# Patient Record
Sex: Female | Born: 1950
Health system: Southern US, Community
[De-identification: ages and names within clinical notes are randomized; demographics above are authoritative.]

## PROBLEM LIST (undated history)

## (undated) ENCOUNTER — Emergency Department (HOSPITAL_BASED_OUTPATIENT_CLINIC_OR_DEPARTMENT_OTHER): Discharge status: Absent without leave | Payer: PPO

## (undated) DIAGNOSIS — K8020 Calculus of gallbladder without cholecystitis without obstruction: Secondary | ICD-10-CM

## (undated) DIAGNOSIS — K648 Other hemorrhoids: Secondary | ICD-10-CM

## (undated) DIAGNOSIS — I10 Essential (primary) hypertension: Secondary | ICD-10-CM

## (undated) DIAGNOSIS — T7840XA Allergy, unspecified, initial encounter: Secondary | ICD-10-CM

## (undated) HISTORY — DX: Other hemorrhoids: K64.8

## (undated) HISTORY — DX: Allergy, unspecified, initial encounter: T78.40XA

## (undated) HISTORY — DX: Calculus of gallbladder without cholecystitis without obstruction: K80.20

---

## 1999-08-29 ENCOUNTER — Other Ambulatory Visit: Admission: RE | Admit: 1999-08-29 | Discharge: 1999-08-29 | Payer: Self-pay | Admitting: Obstetrics and Gynecology

## 2000-09-16 ENCOUNTER — Other Ambulatory Visit: Admission: RE | Admit: 2000-09-16 | Discharge: 2000-09-16 | Payer: Self-pay | Admitting: Obstetrics and Gynecology

## 2001-10-13 ENCOUNTER — Other Ambulatory Visit: Admission: RE | Admit: 2001-10-13 | Discharge: 2001-10-13 | Payer: Self-pay | Admitting: Obstetrics and Gynecology

## 2002-03-19 ENCOUNTER — Encounter: Admission: RE | Admit: 2002-03-19 | Discharge: 2002-03-19 | Payer: Self-pay | Admitting: Obstetrics and Gynecology

## 2002-03-19 ENCOUNTER — Encounter: Payer: Self-pay | Admitting: Obstetrics and Gynecology

## 2002-11-19 ENCOUNTER — Other Ambulatory Visit: Admission: RE | Admit: 2002-11-19 | Discharge: 2002-11-19 | Payer: Self-pay | Admitting: Obstetrics and Gynecology

## 2003-03-23 ENCOUNTER — Encounter: Payer: Self-pay | Admitting: Obstetrics and Gynecology

## 2003-03-23 ENCOUNTER — Encounter: Admission: RE | Admit: 2003-03-23 | Discharge: 2003-03-23 | Payer: Self-pay | Admitting: Obstetrics and Gynecology

## 2003-12-27 ENCOUNTER — Other Ambulatory Visit: Admission: RE | Admit: 2003-12-27 | Discharge: 2003-12-27 | Payer: Self-pay | Admitting: Obstetrics and Gynecology

## 2003-12-31 ENCOUNTER — Encounter: Admission: RE | Admit: 2003-12-31 | Discharge: 2003-12-31 | Payer: Self-pay | Admitting: Obstetrics and Gynecology

## 2004-02-16 ENCOUNTER — Encounter: Payer: Self-pay | Admitting: Internal Medicine

## 2004-03-23 ENCOUNTER — Encounter: Admission: RE | Admit: 2004-03-23 | Discharge: 2004-03-23 | Payer: Self-pay | Admitting: Obstetrics and Gynecology

## 2004-10-23 ENCOUNTER — Inpatient Hospital Stay (HOSPITAL_COMMUNITY): Admission: EM | Admit: 2004-10-23 | Discharge: 2004-10-25 | Payer: Self-pay | Admitting: Emergency Medicine

## 2004-12-12 ENCOUNTER — Encounter: Admission: RE | Admit: 2004-12-12 | Discharge: 2004-12-25 | Payer: Self-pay | Admitting: Neurosurgery

## 2005-01-29 ENCOUNTER — Inpatient Hospital Stay (HOSPITAL_COMMUNITY): Admission: RE | Admit: 2005-01-29 | Discharge: 2005-02-03 | Payer: Self-pay | Admitting: Neurosurgery

## 2005-04-24 ENCOUNTER — Encounter: Admission: RE | Admit: 2005-04-24 | Discharge: 2005-04-24 | Payer: Self-pay | Admitting: Obstetrics and Gynecology

## 2005-09-18 ENCOUNTER — Encounter: Admission: RE | Admit: 2005-09-18 | Discharge: 2005-11-01 | Payer: Self-pay | Admitting: Neurosurgery

## 2006-05-25 IMAGING — CR DG CHEST 2V
2 series · 2 of 2 positions shown · non-contrast
Comparison: None

CLINICAL DATA: Lumbar fracture, preop

CHEST - 2 VIEW:

[view not recorded (1 of 2)]
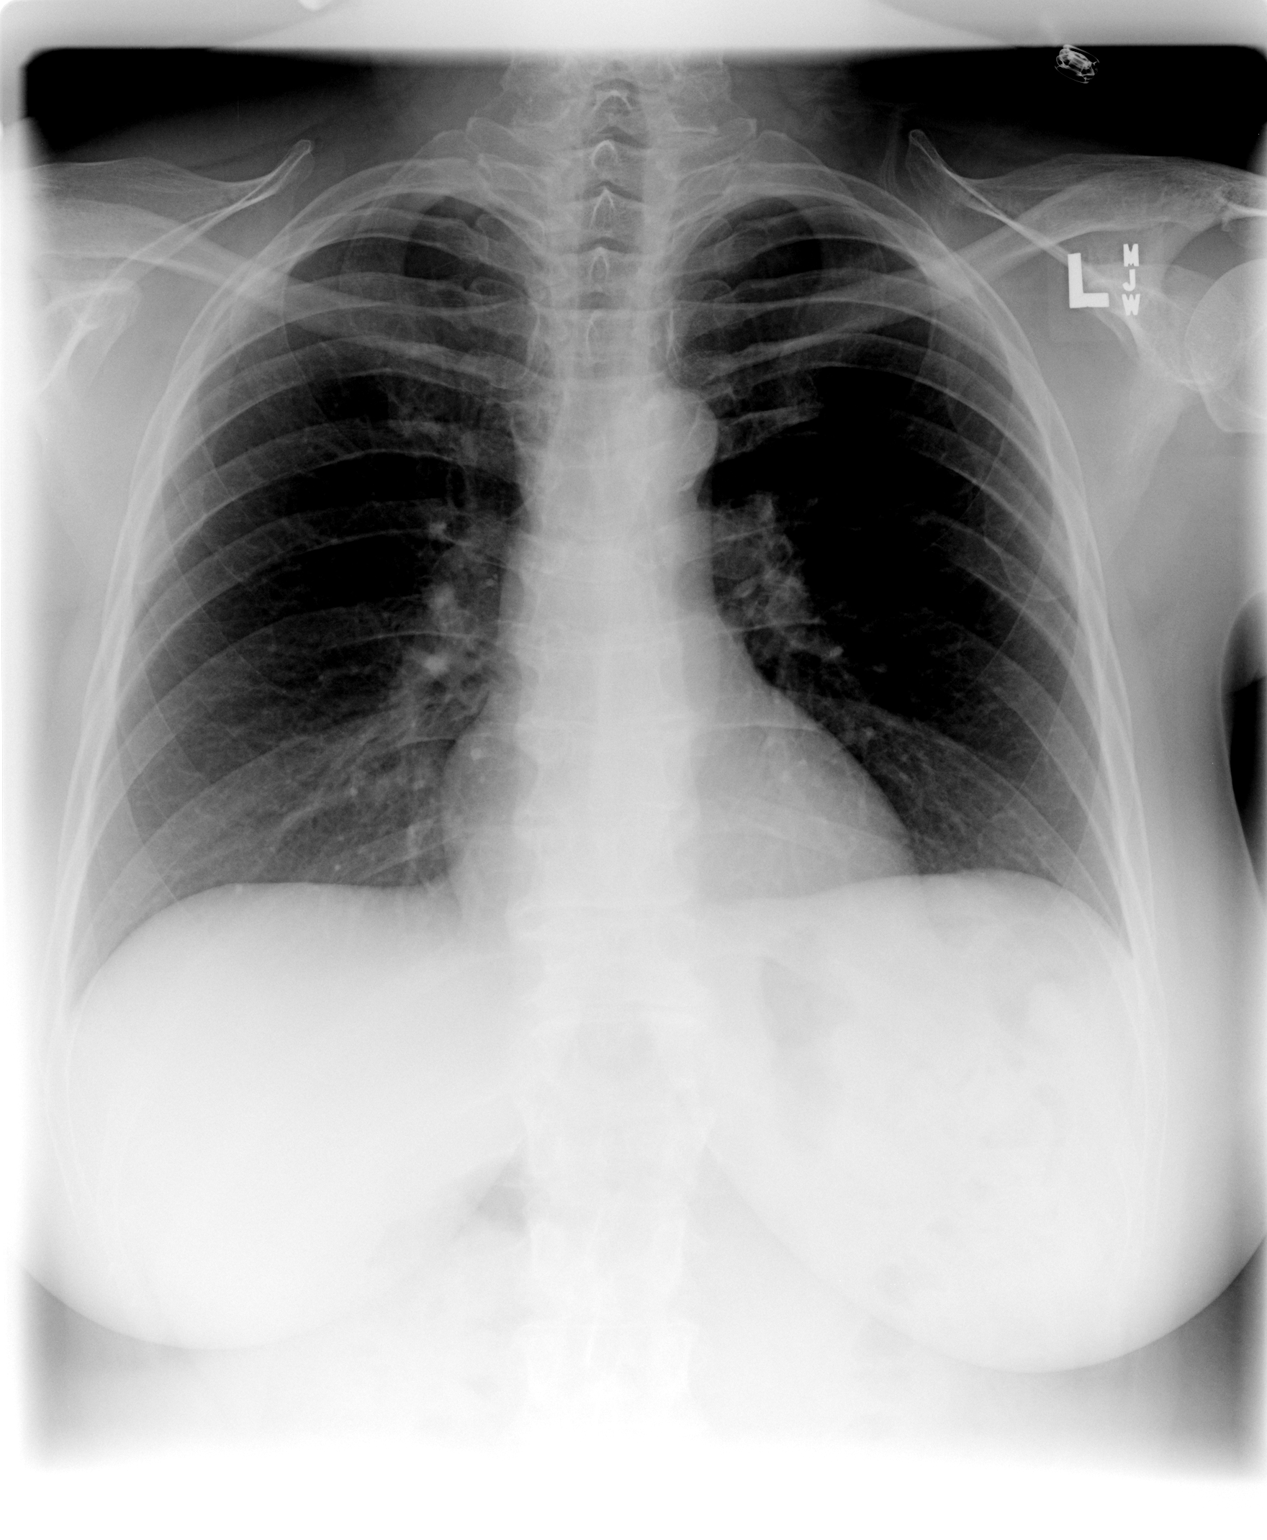

[view not recorded (2 of 2)]
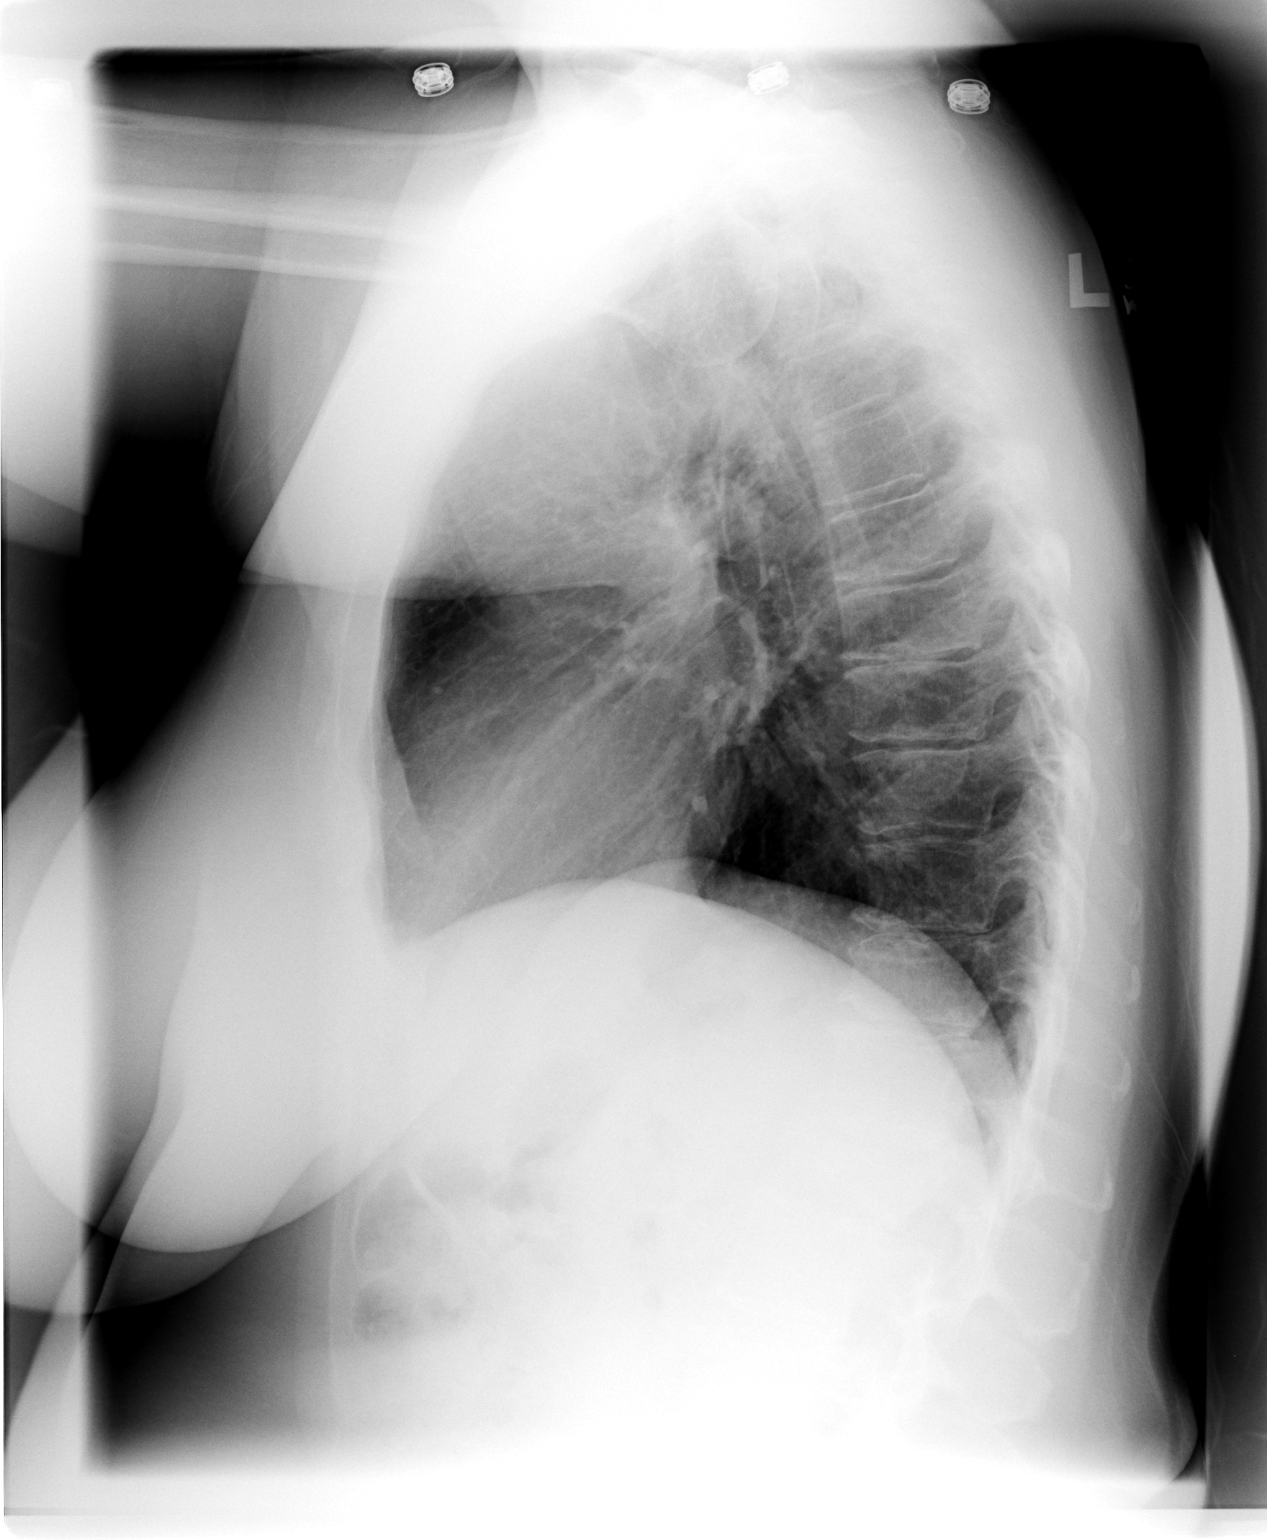

[2 of 2 positions shown; findings below may reference images not displayed]

FINDINGS: The heart size and mediastinal contours are within normal limits. 
Both lungs are clear.  The visualized skeletal structures are unremarkable.
IMPRESSION: No active cardiopulmonary disease

## 2006-05-31 ENCOUNTER — Encounter: Admission: RE | Admit: 2006-05-31 | Discharge: 2006-05-31 | Payer: Self-pay | Admitting: Obstetrics and Gynecology

## 2007-06-02 ENCOUNTER — Encounter: Admission: RE | Admit: 2007-06-02 | Discharge: 2007-06-02 | Payer: Self-pay | Admitting: Obstetrics and Gynecology

## 2007-10-01 ENCOUNTER — Ambulatory Visit: Payer: Self-pay | Admitting: Internal Medicine

## 2007-10-08 ENCOUNTER — Ambulatory Visit: Payer: Self-pay | Admitting: Internal Medicine

## 2007-10-08 LAB — CONVERTED CEMR LAB
Fecal Occult Blood: NEGATIVE
OCCULT 1: NEGATIVE
OCCULT 2: NEGATIVE
OCCULT 3: NEGATIVE
OCCULT 4: NEGATIVE
OCCULT 5: NEGATIVE

## 2008-01-26 DIAGNOSIS — N750 Cyst of Bartholin's gland: Secondary | ICD-10-CM | POA: Insufficient documentation

## 2008-01-26 DIAGNOSIS — K219 Gastro-esophageal reflux disease without esophagitis: Secondary | ICD-10-CM | POA: Insufficient documentation

## 2008-01-26 DIAGNOSIS — I10 Essential (primary) hypertension: Secondary | ICD-10-CM | POA: Insufficient documentation

## 2008-01-26 DIAGNOSIS — T7840XA Allergy, unspecified, initial encounter: Secondary | ICD-10-CM | POA: Insufficient documentation

## 2008-06-02 ENCOUNTER — Encounter: Admission: RE | Admit: 2008-06-02 | Discharge: 2008-06-02 | Payer: Self-pay | Admitting: Obstetrics and Gynecology

## 2009-02-02 ENCOUNTER — Ambulatory Visit: Payer: Self-pay | Admitting: Internal Medicine

## 2009-02-28 ENCOUNTER — Ambulatory Visit: Payer: Self-pay | Admitting: Internal Medicine

## 2009-02-28 ENCOUNTER — Encounter: Payer: Self-pay | Admitting: Internal Medicine

## 2009-03-01 ENCOUNTER — Encounter: Payer: Self-pay | Admitting: Internal Medicine

## 2009-06-09 ENCOUNTER — Encounter: Admission: RE | Admit: 2009-06-09 | Discharge: 2009-06-09 | Payer: Self-pay | Admitting: Obstetrics and Gynecology

## 2009-12-03 HISTORY — PX: BACK SURGERY: SHX140

## 2010-06-13 ENCOUNTER — Encounter: Admission: RE | Admit: 2010-06-13 | Discharge: 2010-06-13 | Payer: Self-pay | Admitting: Obstetrics and Gynecology

## 2010-12-25 ENCOUNTER — Encounter
Admission: RE | Admit: 2010-12-25 | Discharge: 2010-12-25 | Payer: Self-pay | Source: Home / Self Care | Attending: Obstetrics and Gynecology | Admitting: Obstetrics and Gynecology

## 2011-01-04 NOTE — Letter (Signed)
Summary: Patient Notice- Colon Biospy Results  [facility]  [address] [place], [place] [address]   Phone: [phone]  Fax: [phone]        March 01, 2009 MRN: [id number]    [facility] [address] [place], [place]  [address]    Dear Ms. Mankey,  I am pleased to inform you that the biopsies taken during your recent colonoscopy did not show any evidence of cancer upon pathologic examination.The polyp showed benign  tissue ( lymphoid  tissue)  Additional information/recommendations:  _x_No further action is needed at this time.  Please follow-up with      your primary care physician for your other healthcare needs.  __Please call [phone] to schedule a return visit to review      your condition.  __Continue with the treatment plan as outlined on the day of your      exam.  _x_You should have a repeat colonoscopy examination for this problem           in 7_ years.  Please call us if you are having persistent problems or have questions about your condition that have not been fully answered at this time.  Sincerely,  Hart Carwin   This letter has been electronically signed by your physician.

## 2011-01-04 NOTE — Procedures (Signed)
Summary: Colonoscopy   Colonoscopy  Procedure date:  02/28/2009  Findings:      Location:  [facility].    Procedures Next Due Date:    Colonoscopy: 03/2016  COLONOSCOPY PROCEDURE REPORT  PATIENT:  Sheryl Schmidt, Sheryl Schmidt  MR#:  [id number] BIRTHDATE:   02/14/1951, 57 yrs. old   GENDER:   female  ENDOSCOPIST:   Hedwig Morton. Juanda Chance, MD Referred by: Maxie Better, M.D.  PROCEDURE DATE:  02/28/2009 PROCEDURE:  Colonoscopy with biopsy ASA CLASS:   Class I INDICATIONS: family history of colon cancer F. hx of colon cancer in a maternal uncle, last colon 2005, heme positive stool in 2008, intermittent rectal bleeding, known external hemorrhoids  MEDICATIONS:    Versed 8 mg, Fentanyl 100 mcg  DESCRIPTION OF PROCEDURE:   After the risks benefits and alternatives of the procedure were thoroughly explained, informed consent was obtained.  Digital rectal exam was performed and revealed no rectal masses.   The LB [id number]-[id number] endoscope was introduced through the anus and advanced to the cecum, which was identified by both the appendix and ileocecal valve, without limitations.  The quality of the prep was good, using MiraLax.  The instrument was then slowly withdrawn as the colon was fully examined. <<PROCEDUREIMAGES>>        <<OLD IMAGES>>  FINDINGS:  External hemorrhoids were found.  This was otherwise a normal examination of the colon (see image3, image2, and image1).  A sessile polyp was found in the rectum. The polyp was removed using cold biopsy forceps (see image5 and image4).   Retroflexed views in the rectum revealed no abnormalities.    The scope was then withdrawn from the patient and the procedure completed.  COMPLICATIONS:   None  ENDOSCOPIC IMPRESSION:  1) External hemorrhoids  2) Otherwise normal examination  5 mm sessile polyp removed from the rectum RECOMMENDATIONS:  1) await pathology results  2) high fiber diet  REPEAT EXAM:   In 5 year(s)  for.   _______________________________ Hedwig Morton. Juanda Chance, MD  CC: Kirby Funk MD     REPORT OF SURGICAL PATHOLOGY   Case #: [id number] Patient Name: Sheryl Schmidt, Sheryl Schmidt. Office Chart Number:  [id number]   MRN: [id number] Pathologist: Beulah Gandy. Luisa Hart, MD DOB/Age  [birth year] (Age: 36)    Gender: F Date Taken:  02/28/2009 Date Received: 02/28/2009   FINAL DIAGNOSIS   ***MICROSCOPIC EXAMINATION AND DIAGNOSIS***   RECTAL POLYP:  POLYPOID COLONIC MUCOSA WITH BENIGN LYMPHOID AGGREGATES.  NO ADENOMATOUS CHANGE OR CARCINOMA IDENTIFIED.    mj Date Reported:  03/01/2009     Beulah Gandy. Luisa Hart, MD *** Electronically Signed Out By JDP ***   March 01, 2009 MRN: [id number]    [facility] [address] [place], [place]  [address]    Dear Sheryl Schmidt,  I am pleased to inform you that the biopsies taken during your recent colonoscopy did not show any evidence of cancer upon pathologic examination.The polyp showed benign  tissue ( lymphoid  tissue)  Additional information/recommendations:  _x_No further action is needed at this time.  Please follow-up with      your primary care physician for your other healthcare needs.  __Please call [phone] to schedule a return visit to review      your condition.  __Continue with the treatment plan as outlined on the day of your      exam.  _x_You should have a repeat colonoscopy examination for this problem  in 7_ years.  Please call us if you are having persistent problems or have questions about your condition that have not been fully answered at this time.  Sincerely,  Hart Carwin   This letter has been electronically signed by your physician.

## 2011-01-04 NOTE — Procedures (Signed)
Summary: Gastroenterology C  Gastroenterology C   Imported By: Lowry Ram CMA 01/27/2008 09:50:48  _____________________________________________________________________  External Attachment:    Type:   Image     Comment:   External Document

## 2011-01-04 NOTE — Miscellaneous (Signed)
Summary: previsit  Clinical Lists Changes  Medications: Added new medication of MIRALAX   POWD (POLYETHYLENE GLYCOL 3350) As per prep  instructions. - Signed Added new medication of DULCOLAX 5 MG  TBEC (BISACODYL) Day before procedure take 2 at 3pm and 2 at 8pm. - Signed Added new medication of METOCLOPRAMIDE HCL 10 MG  TABS (METOCLOPRAMIDE HCL) As per prep instructions. - Signed Rx of MIRALAX   POWD (POLYETHYLENE GLYCOL 3350) As per prep  instructions.;  #255gm x 0;  Signed;  Entered by: Alease Frame RN;  Authorized by: Hart Carwin MD;  Method used: [facility] [address] [phone]*, [address], [place], [place], [place]  [address], Ph: [phone] or [phone], Fax: [phone] Rx of DULCOLAX 5 MG  TBEC (BISACODYL) Day before procedure take 2 at 3pm and 2 at 8pm.;  #4 x 0;  Signed;  Entered by: Alease Frame RN;  Authorized by: Hart Carwin MD;  Method used: [facility] [address] [phone]*, [address] [place], [place], [place]  [address], Ph: [phone] or [phone], Fax: [phone] Rx of METOCLOPRAMIDE HCL 10 MG  TABS (METOCLOPRAMIDE HCL) As per prep instructions.;  #2 x 0;  Signed;  Entered by: Alease Frame RN;  Authorized by: Hart Carwin MD;  Method used: [facility]ically [facility] [address] [phone]*, [address] [place], [place], [place]  [address], Ph: [phone] or [phone], Fax: [phone] Allergies: Added new allergy or adverse reaction of SULFA Observations: Added new observation of ALLERGY REV: Done (02/02/2009 16:36) Added new observation of NKA: F (02/02/2009 16:36)    Prescriptions: METOCLOPRAMIDE HCL 10 MG  TABS (METOCLOPRAMIDE HCL) As per prep instructions.  #2 x 0   Entered by:   Alease Frame RN   Authorized by:   Hart Carwin MD   Signed by:   Alease Frame RN on 02/02/2009   Method used:   [facility]ronically to        [facility]  [address]  [phone]* (retail)       [address]       [place], [place]  [address]       Ph: [phone] or [phone]       Fax: [phone]   RxID:   [id number] DULCOLAX 5 MG  TBEC (BISACODYL) Day before procedure take 2 at 3pm and 2 at 8pm.  #4 x 0   Entered by:   Alease Frame RN   Authorized by:   Hart Carwin MD   Signed by:   Alease Frame RN on 02/02/2009   Method used:   [facility]ronically to        [facility] [phone]* (retail)       [address]       [place], [place]  [address]       Ph: [phone] or [phone]       Fax: [phone]   RxID:   [id number] MIRALAX   POWD (POLYETHYLENE GLYCOL 3350) As per prep  instructions.  #255gm x 0   Entered by:   Alease Frame RN   Authorized by:   Hart Carwin MD   Signed by:   Alease Frame RN on 02/02/2009   Method used:   Electronically to  [facility] [address] [phone]* (retail)       [address]       [place], [place]  [address]       Ph: [phone] or [phone]       Fax: [phone]   RxID:   [id number]

## 2011-04-17 NOTE — Assessment & Plan Note (Signed)
[facility]                         GASTROENTEROLOGY OFFICE NOTE   Sheryl Schmidt, Sheryl Schmidt                   MRN:          [id number]  DATE:10/01/2007                            DOB:          [birth year]    Ms. Sheryl Schmidt is a very nice 60 year old patient of Dr. Cherly Schmidt whom we  saw in March 2005 for screening colonoscopy.  She has a positive family  history of colon cancer in maternal uncle, but her colon exam was  entirely normal.  She did not have any polyps and her prep was good.  During her routine yearly exam with Dr. Cherly Schmidt, she had a home test for  occult blood in the stool which was positive.  The patient admits to  occasional bright red blood on the toilet tissue when she wipes and  blames it on hemorrhoids.  She denies any rectal pain, any prolapse of  the tissues of the rectum.  Her bowel habits are regular, somewhat on  the constipated side.  She is a regular blood donor, about 2 or 3 times  a year gives blood and has never been turned down.  Her last blood  donation was about 2 months ago.  She has never been anemic.  She denies  taking any aspirin or NSAIDs on a regular basis.  She has occasionally  taken ranitidine for gastroesophageal reflux.   MEDICATIONS:  1. Atacand 8 mg 1 p.o. daily.  2. Ranitidine 1 p.o. daily.   PAST HISTORY:  Significant for high blood pressure.   FAMILY HISTORY:  Diabetes on father's side.   SOCIAL HISTORY:  Divorced with 2 children.  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  She does smoke and does not drink alcohol.   REVIEW OF SYSTEMS:  Positive for eyeglasses, allergies, back pain for  which she takes ibuprofen on occasion.   PHYSICAL EXAMINATION:  Blood pressure 124/82, pulse 64 and weight 163  pounds.  She appeared healthy in no distress.  LUNGS:  Clear to auscultation.  COR:  Normal S2, normal S2.  ABDOMEN:  Soft, nontender, with normoactive bowel sounds.  Liver edge at  costal margin.  Lower  abdomen was normal.  Rectal and anoscopic exam  today reveals small internal hemorrhoids with some erythema, no definite  fissure, no bleeding.  Stool was trace Heme-positive.  There was an  external hemorrhoidal tag at the rectal os which was not active.  There  was no evidence of proctitis.   IMPRESSION:  A 60 year old white female with trace Heme-positive stool  on home test and reproduced on today's exam.  She does have visible  hemorrhoids, but they do not seem to be bleeding.  She is a regular  blood donor and has never been anemic.  The patient had a good quality  colon exam within the last 3-1/2 years.  She is hesitant to undergo  another colonoscopic exam, and I feel we probably ought to recheck her  stool Hemoccults again, and make sure we do it right.  Make sure she  does not take any NSAIDs at the time of the home stool test.  PLAN:  Repeat the Hemoccults.  If they are positive, she will undergo  repeat colonoscopy.  If they are negative, I would probably watch her  carefully, maybe even treat the hemorrhoids with the Anusol HC  suppositories and repeat the Hemoccults within 6 months.  Her normal  recall colonoscopy would have been in 2015, but if the stools remain  positive, she probably ought to have a colon exam before that.  We will  let the patient know about the results of her repeat home test.     Sheryl Schmidt. Sheryl Chance, MD  Electronically Signed    DMB/MedQ  DD: 10/01/2007  DT: 10/01/2007  Job #: [id number]   cc:   Sheryl Schmidt, M.D.

## 2011-04-20 NOTE — Discharge Summary (Signed)
NAMEJANINE, Sheryl Schmidt            ACCOUNT NO.:  [id number]   MEDICAL RECORD NO.:  [id number]          PATIENT TYPE:  INP   LOCATION:  3001                         FACILITY:  [facility]   PHYSICIAN:  Coletta Memos, M.D.     DATE OF BIRTH:  [birth year]   DATE OF ADMISSION:  01/29/2005  DATE OF DISCHARGE:  02/03/2005                                 DISCHARGE SUMMARY   ADMISSION DIAGNOSIS:  L1 compression fracture.   DISCHARGE DIAGNOSIS:  L1 compression fracture.   PROCEDURES:  1.  Retroperitoneal L1 corpectomy.  2.  T12-L2 fusion with instrumentation.   COMPLICATIONS:  None.   DISCHARGE STATUS:  Alive and well.   HISTORY OF PRESENT ILLNESS:  Sheryl Schmidt is a young lady who had an L1  compression fracture.  She did well, but had increasing kyphosis over the  period of time that she was followed by Dr. Lovell Sheehan.  He therefore  recommended and she agreed to undergo operative decompression and reduction.   HOSPITAL COURSE:  She was admitted, taken to the operating room and had an  uncomplicated procedure.  She had a very small pneumothorax postoperatively,  which was monitored.  She never problems with her respiratory effort or  efficiency.  At discharge, her wound is clean and dry with normal vital  signs at discharge.  She has 5/5 strength.  She is to be discharged home on  February 03, 2005.  She is tolerating a regular diet, eating well and voiding  without difficulty.      KC/MEDQ  D:  04/13/2005  T:  04/15/2005  Job:  [id number]

## 2011-04-20 NOTE — Discharge Summary (Signed)
[name], Sheryl Schmidt            ACCOUNT NO.:  [id number]   MEDICAL RECORD NO.:  [id number]          PATIENT TYPE:  INP   LOCATION:  3001                         FACILITY:  [facility]   PHYSICIAN:  Coletta Memos, M.D.     DATE OF BIRTH:  [birth year]   DATE OF ADMISSION:  01/29/2005  DATE OF DISCHARGE:  02/03/2005                                 DISCHARGE SUMMARY   ADMITTING DIAGNOSIS:  L1 compression fracture.   DISCHARGE DIAGNOSIS:  L1 compression fracture.   PROCEDURE:  Retroperitoneal L1 corpectomy, T12-L1 interbody fusion, with  instrumentation.   COMPLICATIONS:  None.   INDICATIONS:  [name] is a woman who fell from a ladder in November of  2005 suffering a L1 compression fracture.  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  As a result, it  was decided by Dr. Lovell Sheehan to take her to the operating room for fusion.   DESCRIPTION OF PROCEDURE:  She was taken to the operating room and had an  uncomplicated procedure.  Postoperatively, she did well.  She had a small  amount of pneumothorax which is not felt to be significant or need  treatment.  She was discharged home with a normal neurologic examination.  The wound was clean and dry without signs of infection.      KC/MEDQ  D:  03/30/2005  T:  03/31/2005  Job:  [id number]

## 2011-04-20 NOTE — H&P (Signed)
Sheryl Schmidt, Sheryl Schmidt            ACCOUNT NO.:  [id number]   MEDICAL RECORD NO.:  [id number]          PATIENT TYPE:  INP   LOCATION:  3021                         FACILITY:  [facility]   PHYSICIAN:  Cristi Loron, M.D.DATE OF BIRTH:  07-11-1951   DATE OF ADMISSION:  10/22/2004  DATE OF DISCHARGE:                                HISTORY & PHYSICAL   CHIEF COMPLAINT:  Back pain.   HISTORY OF PRESENT ILLNESS:  The patient is a 60 year old white female who  was up on a ladder/roof cleaning some gutters.  She fell off and landed on  her deck.  There was no loss of consciousness; the patient remembers the  whole event.  The patient had immediate onset of back pain, but got up and  tried to walk off.  She had back pain.  She went and called a neighbor,  who came over and they called EMS; she was brought to [facility]  where she was evaluated by Dr. Adriana Simas.  Evaluation included lumbar spine x-  rays and a lumbar spine CT which demonstrated L1 burst fracture, and a  neurosurgical consultation was requested.   Presently, the patient is pleasant.  She complains only of back pain in the  upper lumbar region.  She has no radicular leg pain, numbness, tingling,  weakness, etc.  She denies neck pain, headaches, seizures, nausea, vomiting,  numbness, tingling, weakness, etc.   PAST MEDICAL HISTORY:  Past history is positive for hypertension.   PAST SURGICAL HISTORY:  Cesarean sections.   MEDICATIONS PRIOR TO ADMISSION:  Atacand 4 mg p.o. daily.   DRUG ALLERGIES:  SULFA.   FAMILY MEDICAL HISTORY:  The patient's mother is age 18 and father is age  15, both fairly healthy.   SOCIAL HISTORY:  The patient is single.  She has 2 children.  She works at  [employer].  She lives in [place].  She denies tobacco, ethanol and drug  use.   REVIEW OF SYSTEMS:  Review of systems is negative, except as above.   PHYSICAL EXAMINATION:  GENERAL:  A pleasant 45 year old white female who is  accompanied by her boyfriend.  HEENT:  Normocephalic, atraumatic.  Pupils equal, round and reactive to  light.  Extraocular muscles are intact.  Oropharynx benign.  Tympanic  membranes are clear bilaterally.  There is no hemotympanum.  No evidence of  CSF, otorrhea or rhinorrhea.  NECK:  Neck is supple.  There are no masses, deformity, tracheal deviation,  jugular venous distention or carotid bruit.  There is no tenderness.  She  has a good cervical range of motion in terms of no neck pain.  CHEST:  Thorax symmetrical.  Lungs are clear to auscultation.  HEART:  Regular rate and rhythm.  ABDOMEN:  Abdomen is soft and nontender.  EXTREMITIES:  No obvious deformities.  BACK:  On back exam, the patient has tenderness at the thoracolumbar  junction, otherwise, normal.  NEUROLOGIC:  On neurologic exam, the patient is alert, oriented x2.  Cranial  nerves II-XII are grossly intact bilaterally.  Vision and hearing are  grossly normal bilaterally.  Motor strength  is 5/5 in her bilateral biceps,  triceps, hand grips, psoas, quadriceps, gastrocnemii, extensor hallucis  longi.  Deep tendon reflexes are 1-2/4 in the bilateral biceps, triceps,  quadriceps, gastrocnemii.  There is no ankle clonus.  Sensory exam is intact  to light touch in all tested dermatomes bilaterally.  Cerebellar exam is  intact to rapid alternating movements of the upper extremities bilaterally.   IMAGING STUDIES:  I have reviewed the patient's lumbar CT which demonstrates  the patient has a 3-column burst fracture at L1.  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  The other levels  are fairly intact, except for some chronic degenerative disk disease at L3-  4, L4-5 and L5-S1.  The patient has some mild angulation/kyphosis at L1.   ASSESSMENT AND PLAN:  L1 burst fracture.  I have discussed the situation  with the patient  and her boyfriend.  We reviewed some of the CAT scan  pictures.  I have told her that she is in the gray zone with this fracture,  that her treatment could go either way.  She may heal okay in an orthosis,  i.e., a thoracolumbosacral orthosis, however, it could be in the  thoracolumbosacral orthosis that she has persistent pain, angulation or  develops radicular symptoms, in which case we would need to do surgery.  Also, I discussed the alternatives of surgery initially.  She wants to try  the orthosis first.  I will admit her for pain control, have her fitted for  a thoracolumbosacral orthosis and once we have the thoracolumbosacral  orthosis, have the therapist work with her and get her back on her feet and  ambulate her.  If she tolerates this, I will see her back in the office in a  couple of weeks and see how she heals and follow her with serial x-rays.  If  she does not tolerate the thoracolumbosacral orthosis or ambulation, we will  need to do surgery on her.  I have answered her questions.       JDJ/MEDQ  D:  10/23/2004  T:  10/24/2004  Job:  [id number]

## 2011-04-20 NOTE — Discharge Summary (Signed)
Sheryl Schmidt, Sheryl Schmidt            ACCOUNT NO.:  [id number]   MEDICAL RECORD NO.:  [id number]          PATIENT TYPE:  INP   LOCATION:  3021                         FACILITY:  [facility]   PHYSICIAN:  Hilda Lias, M.D.   DATE OF BIRTH:  May 31, 1951   DATE OF ADMISSION:  10/23/2004  DATE OF DISCHARGE:  10/25/2004                                 DISCHARGE SUMMARY   ADMISSION DIAGNOSIS:  Fracture of L1 with intermediate retrolisthesis.   FINAL DIAGNOSIS:  Fracture of L1 with intermediate retrolisthesis.   CLINICAL HISTORY:  The patient was admitted by Dr. Cristi Loron after  she fell and landed on her deck.  She was seen in the emergency room later  on by Dr. Lovell Sheehan.  It was found that she had a fracture of L1 with an  intermediate retropulsion.  Clinically, the patient was stable.  She was  admitted for observation.   LABORATORY:  Normal.   COURSE IN THE HOSPITAL:  The patient was given some pain medication and she  was seen by physical therapy.  She __________.  Today, she is ambulating and  she is ready to go home.   CONDITION ON DISCHARGE:  Improving.   MEDICATIONS:  Percocet, Flexeril.   DIET:  Regular.   ACTIVITY:  She is not to drive.  She is not to do any heavy lifting.   FOLLOWUP:  To be seen by Dr. Lovell Sheehan in 4 weeks or p.r.n.       EB/MEDQ  D:  10/25/2004  T:  10/26/2004  Job:  [id number]

## 2011-04-20 NOTE — Op Note (Signed)
Sheryl Schmidt, Sheryl Schmidt            ACCOUNT NO.:  [id number]   MEDICAL RECORD NO.:  [id number]          PATIENT TYPE:  INP   LOCATION:  3307                         FACILITY:  [facility]   PHYSICIAN:  Cristi Loron, M.D.DATE OF BIRTH:  [birth year]   DATE OF PROCEDURE:  01/29/2005  DATE OF DISCHARGE:                                 OPERATIVE REPORT   BRIEF HISTORY:  The patient is a 60 year old white female who fell from a  ladder in November of 2005, suffering an L1 compression fracture.  I saw her  back in November and we elected to treat her in a lumbar orthosis.  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  I have discussed various treatment options with her and recommended  she undergo an L1 retroperitoneal corpectomy and fusion.  The patient and  the family weighed the risks, benefits and alternatives to surgery and  decided to proceed with the surgery.   PREOPERATIVE DIAGNOSIS:  L1 compression fracture.   POSTOPERATIVE DIAGNOSIS:  L1 compression fracture.   OPERATION PERFORMED:  Retroperitoneal L1 corpectomy; T12-L1, L1-2 posterior  lumbar interbody fusion; insertion of interbody fusion prosthesis at L1 (  vertistack titanium expandable cage); anterior lumbar instrumentation, T12  to L2 utilizing Antares titanium screws and rods.   SURGEON:  Cristi Loron, M.D.   ASSISTANT:  Stefani Dama, M.D.   ANESTHESIA:  General endotracheal.   ESTIMATED BLOOD LOSS:  400 cc.   SPECIMENS:  None.   DRAINS:  None.   COMPLICATIONS:  None.   DESCRIPTION OF PROCEDURE:  The patient was brought to the operating room by  the anesthesia team.  General endotracheal anesthesia was induced.  We then  carefully turned the patient to the right lateral decubitus position with  the left side up.  We placed an axillary roll.  We then broke the bed so  that we had  a better exposure of the patients left flank.  Her abdomen,  flank and back was all prepared with Betadine scrub and Betadine solution  and sterile drapes were applied.  I then injected the area to be incised  with Marcaine with epinephrine solution.  I used a scalpel to make an  incision which paralleled the T10 rib.  I then used electrocautery to expose  the serratus anterior, latissimus dorsi and the external oblique muscles.  I  used cautery to incise over the T10 rib and then used the rib periosteal  elevators to strip the periosteum from the rib and then we used the rib  cutters to remove the T10 rib.  We saved this rib and later morselized it to  use it in the fusion process.  I then used electrocautery and the Ragnell  scissors to divide the external obliques, internal obliques and transverse  abdominis muscles to gain access to the preperitoneal space.  I then  carefully used the Kitner swabs to dissect into the retroperitoneal space  down to the vertebrae, identified the gracilis  muscle.  I used Kitner swabs  to clear the soft tissue from the vertebrae.  I then inserted a spinal  needle into the exposed intervertebral disk space and obtained an  intraoperative radiograph to confirm our location.  The first needle was in  L2-3.  I then dissected in a cephalad direction and identified the L1-2 and  T12-L1 disk space.  In order to gain access to L1 area, I divided the left  crus of the diaphragm.  We inserted the Omnitract retractor for exposure and  then I incised the L1-2 and T12-L1 intervertebral disk with a 15 blade  scalpel to perform a partial diskectomy using a pituitary forceps.  I then  used the Leksell rongeur to remove part of the L1 vertebra.  We saved this  bone and used it in the fusion process as well.  I then used a high speed  drill to perform an L1 corpectomy.  Then I carefully drilled in a posterior  direction until there was only thin shell of bone overlying the  posterior  longitudinal ligament.  I dissected through it with the [facility] #4 and then  removed the ligamentum using the pituitary forceps and the Kerrison punches.  This exposed the dura.  We got good decompression of the dura from the  caudal edge of T12 to the cephalad edge of L2.   We now turned our attention to the arthrodesis/instrumentation. I obtained a  vertistack titanium cage of the appropriate size.  We prefilled it with a  combination of local morselized autograft bone and the morselized rib.  We  then placed it into the corpectomy site (I should mention that we carefully  cleared all of the intervertebral disk and soft tissue from the vertebral  end plate at Z61 and L2).  We then obtained an intraoperative radiograph.  It looked like it was in good position.  We then expanded the cage until it  was in there snugly.   We then turned our attention to the anterior instrumentation, having placed  the prosthesis.  We used the Antares  titanium system.  I used Kitner swabs  and electrocautery to expose the lateral aspect of the L2 and T12 vertebral  bodies.  I had to ligate the vertebral at L1 and T12 in order to get  exposure for the corpectomy as well as placement of the instrumentation.  I  did this by placing Hemoclips and using electrocautery.  I placed the staple  appropriately at T12 and at L2, used an awl to create a hole, tapped the  hole and then placed two 6 x 40 mm screws at T12 and at L2.  We then  obtained the appropriate length rod.  We connected the screws appropriately  and then tightened them and secured the rods in place with the caps and then  placed a cross connector between the two rods and secured this completing  the instrumentation.  We then obtained stringent hemostasis with bipolar  cautery.  I copiously irrigated the wound out with bacitracin solution.  We inspected the dura and it was well decompressed.  We laid the remainder of  the autograft around  the cage to further augment the fusion.  We then  removed the retractors.  At this point we had a rent in the pleura.  We  closed this with a running 3-0 chromic suture.   We then reapproximated the patients transverse abdominis muscle, the  internal and external oblique muscles, the serratus  anterior and the  latissimus dorsi with interrupted #1 Vicryl sutures.  We then reapproximated  the patients subcutaneous tissue with interrupted 2-0 Vicryl suture and the  skin with Steri-Strips and Benzoin. The wound was then coated with  bacitracin ointment, sterile dressing was applied, the drapes were removed.  The patient was subsequently returned to supine position where she was  extubated by the anesthesia team and transported to the post anesthesia care  unit in stable condition.  All sponge, needle and instrument counts were  correct at the end of the case.      JDJ/MEDQ  D:  01/29/2005  T:  01/30/2005  Job:  [id number]

## 2011-06-29 ENCOUNTER — Other Ambulatory Visit: Payer: Self-pay | Admitting: Obstetrics and Gynecology

## 2011-06-29 DIAGNOSIS — Z1231 Encounter for screening mammogram for malignant neoplasm of breast: Secondary | ICD-10-CM

## 2011-07-04 ENCOUNTER — Ambulatory Visit
Admission: RE | Admit: 2011-07-04 | Discharge: 2011-07-04 | Disposition: A | Discharge status: Home / Self Care | Payer: BC Managed Care – PPO | Source: Ambulatory Visit | Attending: Obstetrics and Gynecology | Admitting: Obstetrics and Gynecology

## 2011-07-04 DIAGNOSIS — Z1231 Encounter for screening mammogram for malignant neoplasm of breast: Secondary | ICD-10-CM

## 2011-11-06 ENCOUNTER — Other Ambulatory Visit: Payer: Self-pay | Admitting: Dermatology

## 2012-07-17 ENCOUNTER — Encounter: Payer: Self-pay | Admitting: Neurology

## 2012-08-01 ENCOUNTER — Other Ambulatory Visit: Payer: Self-pay | Admitting: Obstetrics and Gynecology

## 2012-08-01 DIAGNOSIS — Z1231 Encounter for screening mammogram for malignant neoplasm of breast: Secondary | ICD-10-CM

## 2012-08-15 ENCOUNTER — Ambulatory Visit: Payer: BC Managed Care – PPO

## 2012-08-18 ENCOUNTER — Ambulatory Visit
Admission: RE | Admit: 2012-08-18 | Discharge: 2012-08-18 | Disposition: A | Discharge status: Home / Self Care | Payer: PRIVATE HEALTH INSURANCE | Source: Ambulatory Visit | Attending: Obstetrics and Gynecology | Admitting: Obstetrics and Gynecology

## 2012-08-18 DIAGNOSIS — Z1231 Encounter for screening mammogram for malignant neoplasm of breast: Secondary | ICD-10-CM

## 2013-07-14 ENCOUNTER — Other Ambulatory Visit: Payer: Self-pay

## 2013-07-14 DIAGNOSIS — Z1231 Encounter for screening mammogram for malignant neoplasm of breast: Secondary | ICD-10-CM

## 2013-08-07 ENCOUNTER — Encounter: Payer: Self-pay | Admitting: Neurology

## 2013-09-02 ENCOUNTER — Ambulatory Visit: Payer: PRIVATE HEALTH INSURANCE

## 2013-09-08 ENCOUNTER — Ambulatory Visit: Payer: PRIVATE HEALTH INSURANCE

## 2013-10-01 ENCOUNTER — Ambulatory Visit
Admission: RE | Admit: 2013-10-01 | Discharge: 2013-10-01 | Disposition: A | Discharge status: Home / Self Care | Payer: Self-pay | Source: Ambulatory Visit

## 2013-10-01 DIAGNOSIS — Z1231 Encounter for screening mammogram for malignant neoplasm of breast: Secondary | ICD-10-CM

## 2013-10-02 ENCOUNTER — Other Ambulatory Visit: Payer: Self-pay | Admitting: Obstetrics and Gynecology

## 2013-10-02 DIAGNOSIS — R928 Other abnormal and inconclusive findings on diagnostic imaging of breast: Secondary | ICD-10-CM

## 2013-10-22 ENCOUNTER — Other Ambulatory Visit: Payer: BC Managed Care – PPO

## 2013-11-04 ENCOUNTER — Other Ambulatory Visit: Payer: BC Managed Care – PPO

## 2013-11-17 ENCOUNTER — Ambulatory Visit
Admission: RE | Admit: 2013-11-17 | Discharge: 2013-11-17 | Disposition: A | Discharge status: Home / Self Care | Payer: Self-pay | Source: Ambulatory Visit | Attending: Obstetrics and Gynecology | Admitting: Obstetrics and Gynecology

## 2013-11-17 DIAGNOSIS — R928 Other abnormal and inconclusive findings on diagnostic imaging of breast: Secondary | ICD-10-CM

## 2013-12-14 ENCOUNTER — Telehealth: Payer: Self-pay | Admitting: *Deleted

## 2013-12-14 NOTE — Telephone Encounter (Signed)
Pt asked what antifungal cream to use on athletes feet.  Left a message that could use Lamisil cream for 2 weeks but if continued to have a problem needed to be evaluated by a doctor.

## 2014-02-09 ENCOUNTER — Ambulatory Visit
Admission: RE | Admit: 2014-02-09 | Discharge: 2014-02-09 | Disposition: A | Discharge status: Home / Self Care | Payer: BC Managed Care – PPO | Source: Ambulatory Visit | Attending: Nurse Practitioner | Admitting: Nurse Practitioner

## 2014-02-09 ENCOUNTER — Other Ambulatory Visit: Payer: Self-pay | Admitting: Nurse Practitioner

## 2014-02-09 DIAGNOSIS — R1110 Vomiting, unspecified: Secondary | ICD-10-CM

## 2014-02-09 DIAGNOSIS — R109 Unspecified abdominal pain: Secondary | ICD-10-CM

## 2014-02-10 ENCOUNTER — Ambulatory Visit (INDEPENDENT_AMBULATORY_CARE_PROVIDER_SITE_OTHER): Payer: BC Managed Care – PPO | Admitting: General Surgery

## 2014-02-10 ENCOUNTER — Encounter (INDEPENDENT_AMBULATORY_CARE_PROVIDER_SITE_OTHER): Payer: Self-pay | Admitting: General Surgery

## 2014-02-10 VITALS — BP 102/70 | HR 70 | Temp 97.8°F | Resp 14 | Ht 60.0 in | Wt 157.0 lb

## 2014-02-10 DIAGNOSIS — K8020 Calculus of gallbladder without cholecystitis without obstruction: Secondary | ICD-10-CM | POA: Insufficient documentation

## 2014-02-10 NOTE — Patient Instructions (Signed)
[facility] ______[facility] LAPAROSCOPIC SURGERY: POST OP INSTRUCTIONS Always review your discharge instruction sheet given to you by the facility where your surgery was performed. IF YOU HAVE DISABILITY OR FAMILY LEAVE FORMS, YOU MUST BRING THEM TO THE OFFICE FOR PROCESSING.   DO NOT GIVE THEM TO YOUR DOCTOR.  1. A prescription for pain medication may be given to you upon discharge.  Take your pain medication as prescribed, if needed.  If narcotic pain medicine is not needed, then you may take acetaminophen (Tylenol) or ibuprofen (Advil) as needed. 2. Take your usually prescribed medications unless otherwise directed. 3. If you need a refill on your pain medication, please contact your pharmacy.  They will contact our office to request authorization. Prescriptions will not be filled after 5pm or on week-ends. 4. You should follow a light diet the first few days after arrival home, such as soup and crackers, etc.  Be sure to include lots of fluids daily. 5. Most patients will experience some swelling and bruising in the area of the incisions.  Ice packs will help.  Swelling and bruising can take several days to resolve.  6. It is common to experience some constipation if taking pain medication after surgery.  Increasing fluid intake and taking a stool softener (such as Colace) will usually help or prevent this problem from occurring.  A mild laxative (Milk of Magnesia or Miralax) should be taken according to package instructions if there are no bowel movements after 48 hours. 7. Unless discharge instructions indicate otherwise, you may remove your bandages 72 hours after surgery, and you may shower at that time.  You may have steri-strips (small skin tapes) in place directly over the incision.  These strips should be left on the skin for 7-10 days.  If your surgeon used skin glue on the incision, you may shower in 24 hours.  The glue will flake off over the next 2-3 weeks.  Any sutures or  staples will be removed at the office during your follow-up visit. 8. ACTIVITIES:  You may resume regular (light) daily activities beginning the next day-such as daily self-care, walking, climbing stairs-gradually increasing activities as tolerated.  You may have sexual intercourse when it is comfortable.  Refrain from any heavy lifting or straining until approved by your doctor. a. You may drive when you are no longer taking prescription pain medication, you can comfortably wear a seatbelt, and you can safely maneuver your car and apply brakes. b. RETURN TO WORK:  Desk work in 5-7 days, full duty in 2 weeks.__________________________________________________________ 9. You should see your doctor in the office for a follow-up appointment approximately 2-3 weeks after your surgery.  Make sure that you call for this appointment within a day or two after you arrive home to insure a convenient appointment time. 10. OTHER INSTRUCTIONS: __________________________________________________________________________________________________________________________ __________________________________________________________________________________________________________________________ WHEN TO CALL YOUR DOCTOR: 1. Fever over 101.0 2. Inability to urinate 3. Continued bleeding from incision. 4. Increased pain, redness, or drainage from the incision. 5. Increasing abdominal pain  The clinic staff is available to answer your questions during regular business hours.  Please don't hesitate to call and ask to speak to one of the nurses for clinical concerns.  If you have a medical emergency, go to the nearest emergency room or call 911.  A surgeon from [facility] is always on call at the hospital. [address] [place], [place], [place]  [address] ? [address], [place], [place]   [address] [phone]  [phone] ? [phone] ? FAX [phone] Web site: [email or web address]

## 2014-02-10 NOTE — Progress Notes (Signed)
Patient ID: Sheryl Schmidt, female   DOB: 21-Jan-1951, 63 y.o.   MRN: [id number]  Chief Complaint  Patient presents with  . New Evaluation    eval GB    HPI Braxtyn Schmidt is a 63 y.o. female.   HPI  She is self-referred. She's been having intermittent episodes of right upper quadrant pain that radiated to the epigastrium. These occur at night. Recently they have become more severe and then radiated to the back.  The last 2 have followed her eating ranch dressing or using olive oil to her vegetables in. Her last one was associated with nausea and vomiting. She saw her primary care physician who ordered an ultrasound demonstrated a 2 cm gallstone.  This was in the gallbladder neck. It is mobile. Common bile duct diameter 2.7 mm. There is a small cyst in the left lobe of the liver.  She does have a family history of gallbladder disease in that her daughter had a cholecystectomy.  She presents here for further evaluation and treatment.  History reviewed. No pertinent past medical history.  Past Surgical History  Procedure Laterality Date  . Back surgery  2007    removed L1 and removed a rib    Family History  Problem Relation Age of Onset  . COPD Father     Social History History  Substance Use Topics  . Smoking status: Never Smoker   . Smokeless tobacco: Never Used  . Alcohol Use: Yes     Comment: occassional    Allergies  Allergen Reactions  . Sulfonamide Derivatives     REACTION: rash    Current Outpatient Prescriptions  Medication Sig Dispense Refill  . Calcium Carbonate-Vitamin D (CALCIUM-VITAMIN D) 500-200 MG-UNIT per tablet Take 1 tablet by mouth daily.      Marland Kitchen losartan (COZAAR) 25 MG tablet Take 25 mg by mouth daily.      . Multiple Vitamin (MULTIVITAMIN) tablet Take 1 tablet by mouth daily.      . Omega-3 Krill Oil 1000 MG CAPS Take by mouth.       No current facility-administered medications for this visit.    Review of Systems Review of Systems   Constitutional: Negative for fever and chills.  Respiratory: Negative.   Cardiovascular: Negative.   Gastrointestinal: Positive for nausea, vomiting and abdominal pain. Negative for diarrhea.  Genitourinary: Negative.   Musculoskeletal: Negative.   Neurological: Negative.   Hematological: Negative.     Blood pressure 102/70, pulse 70, temperature 97.8 F (36.6 C), resp. rate 14, height 5' (1.524 m), weight 157 lb (71.215 kg).  Physical Exam Physical Exam  Constitutional: No distress.  Overweight female.  HENT:  Head: Normocephalic and atraumatic.  Eyes: No scleral icterus.  Neck: Neck supple.  Cardiovascular: Normal rate and regular rhythm.   Pulmonary/Chest: Effort normal and breath sounds normal.  Abdominal: Soft. She exhibits no distension and no mass. There is no tenderness.  Musculoskeletal: She exhibits no edema.  Lymphadenopathy:    She has no cervical adenopathy.  Neurological: She is alert.  Skin: Skin is warm and dry.  Psychiatric: She has a normal mood and affect. Her behavior is normal.    Data Reviewed Ultrasound report.  Assessment    Symptomatic cholelithiasis.     Plan    Laparoscopic possible open cholecystectomy with cholangiogram. Strict low fat diet.  I have explained the procedure, risks, and aftercare of cholecystectomy.  Risks include but are not limited to bleeding, infection, wound problems, anesthesia, diarrhea, bile leak, injury  to common bile duct/liver/intestine.  She seems to understand and agrees to proceed.         Jazzalyn Loewenstein J 02/10/2014, 12:08 PM

## 2014-02-15 ENCOUNTER — Encounter: Payer: Self-pay | Admitting: *Deleted

## 2014-02-16 ENCOUNTER — Ambulatory Visit: Payer: BC Managed Care – PPO | Admitting: Internal Medicine

## 2014-02-16 ENCOUNTER — Encounter (HOSPITAL_COMMUNITY): Payer: Self-pay | Admitting: Pharmacy Technician

## 2014-02-17 ENCOUNTER — Ambulatory Visit (HOSPITAL_COMMUNITY)
Admission: RE | Admit: 2014-02-17 | Discharge: 2014-02-17 | Disposition: A | Discharge status: Home / Self Care | Payer: BC Managed Care – PPO | Source: Ambulatory Visit | Attending: General Surgery | Admitting: General Surgery

## 2014-02-17 ENCOUNTER — Encounter (HOSPITAL_COMMUNITY)
Admission: RE | Admit: 2014-02-17 | Discharge: 2014-02-17 | Disposition: A | Discharge status: Home / Self Care | Payer: BC Managed Care – PPO | Source: Ambulatory Visit | Attending: General Surgery | Admitting: General Surgery

## 2014-02-17 ENCOUNTER — Encounter (HOSPITAL_COMMUNITY): Payer: Self-pay

## 2014-02-17 ENCOUNTER — Encounter (HOSPITAL_COMMUNITY): Payer: Self-pay | Admitting: Anesthesiology

## 2014-02-17 DIAGNOSIS — Z01818 Encounter for other preprocedural examination: Secondary | ICD-10-CM

## 2014-02-17 DIAGNOSIS — Z01812 Encounter for preprocedural laboratory examination: Secondary | ICD-10-CM | POA: Insufficient documentation

## 2014-02-17 HISTORY — DX: Essential (primary) hypertension: I10

## 2014-02-17 LAB — COMPREHENSIVE METABOLIC PANEL
ALT: 17 U/L (ref 0–35)
AST: 23 U/L (ref 0–37)
Albumin: 3.5 g/dL (ref 3.5–5.2)
Alkaline Phosphatase: 102 U/L (ref 39–117)
BUN: 9 mg/dL (ref 6–23)
CO2: 27 mEq/L (ref 19–32)
Calcium: 8.8 mg/dL (ref 8.4–10.5)
Chloride: 100 mEq/L (ref 96–112)
Creatinine, Ser: 0.73 mg/dL (ref 0.50–1.10)
GFR calc Af Amer: >90 mL/min (ref >90)
GFR calc non Af Amer: >90 mL/min (ref >90)
Glucose, Bld: 85 mg/dL (ref 70–99)
Potassium: 4.2 mEq/L (ref 3.7–5.3)
Sodium: 137 mEq/L (ref 137–147)
Total Bilirubin: 0.3 mg/dL (ref 0.3–1.2)
Total Protein: 6.7 g/dL (ref 6.0–8.3)

## 2014-02-17 LAB — CBC WITH DIFFERENTIAL/PLATELET
Basophils Absolute: 0 10*3/uL (ref 0.0–0.1)
Basophils Relative: 1 % (ref 0–1)
Eosinophils Absolute: 0.2 10*3/uL (ref 0.0–0.7)
Eosinophils Relative: 5 % (ref 0–5)
HCT: 38.5 % (ref 36.0–46.0)
Hemoglobin: 12.8 g/dL (ref 12.0–15.0)
Lymphocytes Relative: 39 % (ref 12–46)
Lymphs Abs: 1.7 10*3/uL (ref 0.7–4.0)
MCH: 28.3 pg (ref 26.0–34.0)
MCHC: 33.2 g/dL (ref 30.0–36.0)
MCV: 85 fL (ref 78.0–100.0)
Monocytes Absolute: 0.3 10*3/uL (ref 0.1–1.0)
Monocytes Relative: 7 % (ref 3–12)
Neutro Abs: 2.1 10*3/uL (ref 1.7–7.7)
Neutrophils Relative %: 49 % (ref 43–77)
Platelets: 233 10*3/uL (ref 150–400)
RBC: 4.53 MIL/uL (ref 3.87–5.11)
RDW: 13.2 % (ref 11.5–15.5)
WBC: 4.3 10*3/uL (ref 4.0–10.5)

## 2014-02-17 LAB — PROTIME-INR
INR: 0.97 (ref 0.00–1.49)
Prothrombin Time: 12.7 seconds (ref 11.6–15.2)

## 2014-02-17 NOTE — Patient Instructions (Addendum)
20    Your procedure is scheduled on:  Thursday 02/18/2014  Report to [facility] at  [place] AM.  Call this number if you have problems the night before or morning of surgery:  [phone]   Remember:             IF YOU USE CPAP,BRING MASK AND TUBING AM OF SURGERY!             IF YOU DO NOT HAVE YOUR TYPE AND SCREEN DRAWN AT PRE-ADMIT APPOINTMENT, YOU WILL HAVE IT DRAWN AM OF SURGERY!   Do not eat food or drink liquids AFTER MIDNIGHT!  Take these medicines the morning of surgery with A SIP OF WATER: none    [facility] IS NOT RESPONSIBLE FOR ANY BELONGINGS OR VALUABLES BROUGHT TO HOSPITAL.  Marland Kitchen  Leave suitcase in the car. After surgery it may be brought to your room.  For patients admitted to the hospital, checkout time is 11:00 AM the day of              Discharge.    DO NOT WEAR  JEWELRY,MAKE-UP,LOTIONS,POWDERS,PERFUMES,CONTACTS , DENTURES OR BRIDGEWORK ,AND DO NOT WEAR FALSE EYELASHES                                    Patients discharged the day of surgery will not be allowed to drive home. If going home the same day of surgery, must have someone stay with you  first 24 hrs.at home and arrange for someone to drive you home from the [place] MB:[id number]   Special Instructions:              Please read over the following fact sheets that you were given:             1. [place].Reesha Debes,RN,BSN     [phone]                FAILURE TO FOLLOW THESE INSTRUCTIONS MAY RESULT IN   CANCELLATION OF YOUR SURGERY!               Patient Signature:___________________________

## 2014-02-17 NOTE — Anesthesia Preprocedure Evaluation (Addendum)
Anesthesia Evaluation  Patient identified by MRN, date of birth, ID band Patient awake    Reviewed: Allergy & Precautions, H&P , NPO status , Patient's Chart, lab work & pertinent test results  Airway Mallampati: II TM Distance: >3 FB Neck ROM: Full    Dental no notable dental hx.    Pulmonary neg pulmonary ROS,  CXR: NAD breath sounds clear to auscultation  Pulmonary exam normal       Cardiovascular hypertension, Pt. on medications Rhythm:Regular Rate:Normal     Neuro/Psych negative neurological ROS  negative psych ROS   GI/Hepatic Neg liver ROS, GERD-  Medicated,  Endo/Other  negative endocrine ROS  Renal/GU negative Renal ROS  negative genitourinary   Musculoskeletal negative musculoskeletal ROS (+)   Abdominal (+) + obese,   Peds negative pediatric ROS (+)  Hematology negative hematology ROS (+)   Anesthesia Other Findings   Reproductive/Obstetrics negative OB ROS                          Anesthesia Physical Anesthesia Plan  ASA: II  Anesthesia Plan: General   Post-op Pain Management:    Induction: Intravenous  Airway Management Planned: Oral ETT  Additional Equipment:   Intra-op Plan:   Post-operative Plan: Extubation in OR  Informed Consent: I have reviewed the patients History and Physical, chart, labs and discussed the procedure including the risks, benefits and alternatives for the proposed anesthesia with the patient or authorized representative who has indicated his/her understanding and acceptance.   Dental advisory given  Plan Discussed with: CRNA  Anesthesia Plan Comments:         Anesthesia Quick Evaluation

## 2014-02-17 NOTE — Progress Notes (Signed)
07/28/2013-EKG and Last office visit from [facility] on chart. 08/07/2013-Echo and Bilateral Duplex carotid ultrasound from [facility] on chart.

## 2014-02-18 ENCOUNTER — Ambulatory Visit (HOSPITAL_COMMUNITY): Payer: BC Managed Care – PPO | Admitting: Anesthesiology

## 2014-02-18 ENCOUNTER — Encounter (HOSPITAL_COMMUNITY)
Admission: RE | Disposition: A | Discharge status: Home / Self Care | Payer: Self-pay | Source: Ambulatory Visit | Attending: General Surgery

## 2014-02-18 ENCOUNTER — Encounter (HOSPITAL_COMMUNITY): Payer: Self-pay | Admitting: *Deleted

## 2014-02-18 ENCOUNTER — Encounter (HOSPITAL_COMMUNITY): Payer: BC Managed Care – PPO | Admitting: Anesthesiology

## 2014-02-18 ENCOUNTER — Ambulatory Visit (HOSPITAL_COMMUNITY): Payer: BC Managed Care – PPO

## 2014-02-18 ENCOUNTER — Ambulatory Visit (HOSPITAL_COMMUNITY)
Admission: RE | Admit: 2014-02-18 | Discharge: 2014-02-18 | Disposition: A | Discharge status: Home / Self Care | Payer: BC Managed Care – PPO | Source: Ambulatory Visit | Attending: General Surgery | Admitting: General Surgery

## 2014-02-18 DIAGNOSIS — K8010 Calculus of gallbladder with chronic cholecystitis without obstruction: Secondary | ICD-10-CM | POA: Insufficient documentation

## 2014-02-18 DIAGNOSIS — I10 Essential (primary) hypertension: Secondary | ICD-10-CM | POA: Insufficient documentation

## 2014-02-18 DIAGNOSIS — E669 Obesity, unspecified: Secondary | ICD-10-CM | POA: Insufficient documentation

## 2014-02-18 DIAGNOSIS — K219 Gastro-esophageal reflux disease without esophagitis: Secondary | ICD-10-CM | POA: Insufficient documentation

## 2014-02-18 DIAGNOSIS — K7689 Other specified diseases of liver: Secondary | ICD-10-CM | POA: Insufficient documentation

## 2014-02-18 DIAGNOSIS — Z79899 Other long term (current) drug therapy: Secondary | ICD-10-CM | POA: Insufficient documentation

## 2014-02-18 HISTORY — PX: CHOLECYSTECTOMY: SHX55

## 2014-02-18 SURGERY — LAPAROSCOPIC CHOLECYSTECTOMY WITH INTRAOPERATIVE CHOLANGIOGRAM
Anesthesia: General | Site: Abdomen

## 2014-02-18 MED ORDER — ROCURONIUM BROMIDE 100 MG/10ML IV SOLN
INTRAVENOUS | Status: AC
  Filled 2014-02-18: qty 1

## 2014-02-18 MED ORDER — BUPIVACAINE HCL (PF) 0.5 % IJ SOLN
INTRAMUSCULAR | Status: AC
  Filled 2014-02-18: qty 30

## 2014-02-18 MED ORDER — NEOSTIGMINE METHYLSULFATE 1 MG/ML IJ SOLN
INTRAMUSCULAR | Status: DC | PRN
  Administered 2014-02-18: 3 mg via INTRAVENOUS

## 2014-02-18 MED ORDER — DEXAMETHASONE SODIUM PHOSPHATE 10 MG/ML IJ SOLN
INTRAMUSCULAR | Status: DC | PRN
  Administered 2014-02-18: 10 mg via INTRAVENOUS

## 2014-02-18 MED ORDER — IOHEXOL 300 MG/ML  SOLN
INTRAMUSCULAR | Status: DC | PRN
  Administered 2014-02-18: 6 mL

## 2014-02-18 MED ORDER — LIDOCAINE HCL (CARDIAC) 20 MG/ML IV SOLN
INTRAVENOUS | Status: DC | PRN
  Administered 2014-02-18: 100 mg via INTRAVENOUS

## 2014-02-18 MED ORDER — FENTANYL CITRATE 0.05 MG/ML IJ SOLN
INTRAMUSCULAR | Status: DC | PRN
  Administered 2014-02-18 (×3): 50 ug via INTRAVENOUS

## 2014-02-18 MED ORDER — FENTANYL CITRATE 0.05 MG/ML IJ SOLN
INTRAMUSCULAR | Status: AC
  Filled 2014-02-18: qty 5

## 2014-02-18 MED ORDER — MIDAZOLAM HCL 2 MG/2ML IJ SOLN
INTRAMUSCULAR | Status: AC
  Filled 2014-02-18: qty 2

## 2014-02-18 MED ORDER — ONDANSETRON HCL 4 MG/2ML IJ SOLN
INTRAMUSCULAR | Status: AC
  Filled 2014-02-18: qty 2

## 2014-02-18 MED ORDER — HYDROMORPHONE HCL PF 1 MG/ML IJ SOLN
INTRAMUSCULAR | Status: AC
  Filled 2014-02-18: qty 1

## 2014-02-18 MED ORDER — ACETAMINOPHEN 650 MG RE SUPP
650.0000 mg | RECTAL | Status: DC | PRN
  Filled 2014-02-18: qty 1

## 2014-02-18 MED ORDER — LIDOCAINE HCL (CARDIAC) 20 MG/ML IV SOLN
INTRAVENOUS | Status: AC
  Filled 2014-02-18: qty 5

## 2014-02-18 MED ORDER — OXYCODONE HCL 5 MG PO TABS
5.0000 mg | ORAL_TABLET | ORAL | Status: DC | PRN
  Administered 2014-02-18: 5 mg via ORAL
  Filled 2014-02-18: qty 1

## 2014-02-18 MED ORDER — HYDROMORPHONE HCL PF 1 MG/ML IJ SOLN
0.2500 mg | INTRAMUSCULAR | Status: DC | PRN
  Administered 2014-02-18 (×4): 0.5 mg via INTRAVENOUS

## 2014-02-18 MED ORDER — ONDANSETRON HCL 4 MG/2ML IJ SOLN
INTRAMUSCULAR | Status: DC | PRN
  Administered 2014-02-18: 4 mg via INTRAVENOUS

## 2014-02-18 MED ORDER — ACETAMINOPHEN 325 MG PO TABS: 650.0000 mg | ORAL_TABLET | ORAL | Status: DC | PRN

## 2014-02-18 MED ORDER — CEFAZOLIN SODIUM-DEXTROSE 2-3 GM-% IV SOLR
INTRAVENOUS | Status: AC
  Filled 2014-02-18: qty 50

## 2014-02-18 MED ORDER — PROPOFOL 10 MG/ML IV BOLUS
INTRAVENOUS | Status: DC | PRN
  Administered 2014-02-18: 200 mg via INTRAVENOUS

## 2014-02-18 MED ORDER — PHENYLEPHRINE HCL 10 MG/ML IJ SOLN
INTRAMUSCULAR | Status: DC | PRN
  Administered 2014-02-18: 80 ug via INTRAVENOUS

## 2014-02-18 MED ORDER — GLYCOPYRROLATE 0.2 MG/ML IJ SOLN
INTRAMUSCULAR | Status: DC | PRN
  Administered 2014-02-18: 0.4 mg via INTRAVENOUS

## 2014-02-18 MED ORDER — GLYCOPYRROLATE 0.2 MG/ML IJ SOLN
INTRAMUSCULAR | Status: AC
  Filled 2014-02-18: qty 2

## 2014-02-18 MED ORDER — PROPOFOL 10 MG/ML IV BOLUS
INTRAVENOUS | Status: AC
  Filled 2014-02-18: qty 20

## 2014-02-18 MED ORDER — SODIUM CHLORIDE 0.9 % IJ SOLN
3.0000 mL | INTRAMUSCULAR | Status: DC | PRN
Start: 2014-02-18 — End: 2014-02-18

## 2014-02-18 MED ORDER — PHENYLEPHRINE 40 MCG/ML (10ML) SYRINGE FOR IV PUSH (FOR BLOOD PRESSURE SUPPORT)
PREFILLED_SYRINGE | INTRAVENOUS | Status: AC
  Filled 2014-02-18: qty 10

## 2014-02-18 MED ORDER — DEXAMETHASONE SODIUM PHOSPHATE 10 MG/ML IJ SOLN
INTRAMUSCULAR | Status: AC
  Filled 2014-02-18: qty 1

## 2014-02-18 MED ORDER — SUCCINYLCHOLINE CHLORIDE 20 MG/ML IJ SOLN
INTRAMUSCULAR | Status: DC | PRN
  Administered 2014-02-18: 100 mg via INTRAVENOUS

## 2014-02-18 MED ORDER — OXYCODONE HCL 5 MG PO TABS: 5.0000 mg | ORAL_TABLET | ORAL | Status: DC | PRN

## 2014-02-18 MED ORDER — ROCURONIUM BROMIDE 100 MG/10ML IV SOLN
INTRAVENOUS | Status: DC | PRN
  Administered 2014-02-18: 30 mg via INTRAVENOUS

## 2014-02-18 MED ORDER — LACTATED RINGERS IV SOLN
INTRAVENOUS | Status: DC | PRN
  Administered 2014-02-18 (×2): via INTRAVENOUS

## 2014-02-18 MED ORDER — DEXTROSE IN LACTATED RINGERS 5 % IV SOLN: INTRAVENOUS | Status: DC

## 2014-02-18 MED ORDER — MIDAZOLAM HCL 5 MG/5ML IJ SOLN
INTRAMUSCULAR | Status: DC | PRN
  Administered 2014-02-18: 2 mg via INTRAVENOUS

## 2014-02-18 MED ORDER — PROMETHAZINE HCL 25 MG/ML IJ SOLN: 6.2500 mg | INTRAMUSCULAR | Status: DC | PRN

## 2014-02-18 MED ORDER — LACTATED RINGERS IV SOLN
INTRAVENOUS | Status: DC | PRN
  Administered 2014-02-18: 1000 mL

## 2014-02-18 MED ORDER — BUPIVACAINE HCL 0.5 % IJ SOLN
INTRAMUSCULAR | Status: DC | PRN
  Administered 2014-02-18: 20 mL

## 2014-02-18 MED ORDER — CEFAZOLIN SODIUM-DEXTROSE 2-3 GM-% IV SOLR
2.0000 g | INTRAVENOUS | Status: AC
  Administered 2014-02-18: 2 g via INTRAVENOUS

## 2014-02-18 MED ORDER — MORPHINE SULFATE 10 MG/ML IJ SOLN: 2.0000 mg | INTRAMUSCULAR | Status: DC | PRN

## 2014-02-18 SURGICAL SUPPLY — 40 items
APL SKNCLS STERI-STRIP NONHPOA (GAUZE/BANDAGES/DRESSINGS) ×1
APPLIER CLIP 5 13 M/L LIGAMAX5 (MISCELLANEOUS) ×2
APR CLP MED LRG 5 ANG JAW (MISCELLANEOUS) ×1
BAG SPEC RTRVL LRG 6X4 10 (ENDOMECHANICALS) ×1
BENZOIN TINCTURE PRP APPL 2/3 (GAUZE/BANDAGES/DRESSINGS) ×2 IMPLANT
CHLORAPREP W/TINT 26ML (MISCELLANEOUS) ×2 IMPLANT
CLIP APPLIE 5 13 M/L LIGAMAX5 (MISCELLANEOUS) ×1 IMPLANT
COVER MAYO STAND STRL (DRAPES) ×2 IMPLANT
DECANTER SPIKE VIAL GLASS SM (MISCELLANEOUS) ×2 IMPLANT
DISSECTOR BLUNT TIP ENDO 5MM (MISCELLANEOUS) ×1 IMPLANT
DRAPE C-ARM 42X120 X-RAY (DRAPES) ×2 IMPLANT
DRAPE LAPAROSCOPIC ABDOMINAL (DRAPES) ×2 IMPLANT
DRAPE UTILITY XL STRL (DRAPES) ×2 IMPLANT
DRSG TEGADERM 2-3/8X2-3/4 SM (GAUZE/BANDAGES/DRESSINGS) ×6 IMPLANT
ELECT REM PT RETURN 9FT ADLT (ELECTROSURGICAL) ×2
ELECTRODE REM PT RTRN 9FT ADLT (ELECTROSURGICAL) ×1 IMPLANT
ENDOLOOP SUT PDS II  0 18 (SUTURE)
ENDOLOOP SUT PDS II 0 18 (SUTURE) IMPLANT
GAUZE SPONGE 2X2 8PLY STRL LF (GAUZE/BANDAGES/DRESSINGS) ×1 IMPLANT
GLOVE ECLIPSE 8.0 STRL XLNG CF (GLOVE) ×2 IMPLANT
GLOVE INDICATOR 8.0 STRL GRN (GLOVE) ×2 IMPLANT
GOWN STRL REUS W/TWL XL LVL3 (GOWN DISPOSABLE) ×6 IMPLANT
HEMOSTAT SNOW SURGICEL 2X4 (HEMOSTASIS) ×1 IMPLANT
KIT BASIN OR (CUSTOM PROCEDURE TRAY) ×2 IMPLANT
POUCH SPECIMEN RETRIEVAL 10MM (ENDOMECHANICALS) ×2 IMPLANT
SCISSORS LAP 5X35 DISP (ENDOMECHANICALS) ×2 IMPLANT
SET CHOLANGIOGRAPH MIX (MISCELLANEOUS) ×2 IMPLANT
SET IRRIG TUBING LAPAROSCOPIC (IRRIGATION / IRRIGATOR) ×2 IMPLANT
SLEEVE XCEL OPT CAN 5 100 (ENDOMECHANICALS) ×4 IMPLANT
SOLUTION ANTI FOG 6CC (MISCELLANEOUS) ×2 IMPLANT
SPONGE GAUZE 2X2 STER 10/PKG (GAUZE/BANDAGES/DRESSINGS) ×1
STRIP CLOSURE SKIN 1/2X4 (GAUZE/BANDAGES/DRESSINGS) ×2 IMPLANT
SUT MNCRL AB 4-0 PS2 18 (SUTURE) ×2 IMPLANT
TOWEL OR 17X26 10 PK STRL BLUE (TOWEL DISPOSABLE) ×2 IMPLANT
TOWEL OR NON WOVEN STRL DISP B (DISPOSABLE) ×2 IMPLANT
TRAY LAP CHOLE (CUSTOM PROCEDURE TRAY) ×2 IMPLANT
TROCAR BLADELESS OPT 5 100 (ENDOMECHANICALS) ×2 IMPLANT
TROCAR XCEL BLUNT TIP 100MML (ENDOMECHANICALS) ×2 IMPLANT
TROCAR XCEL NON-BLD 11X100MML (ENDOMECHANICALS) IMPLANT
TUBING INSUFFLATION 10FT LAP (TUBING) ×2 IMPLANT

## 2014-02-18 NOTE — Transfer of Care (Signed)
Immediate Anesthesia Transfer of Care Note  Patient: Sheryl Schmidt  Procedure(s) Performed: Procedure(s): LAPAROSCOPIC CHOLECYSTECTOMY WITH INTRAOPERATIVE CHOLANGIOGRAM (N/A)  Patient Location: PACU  Anesthesia Type:General  Level of Consciousness: sedated  Airway & Oxygen Therapy: Patient Spontanous Breathing and Patient connected to face mask oxygen  Post-op Assessment: Report given to PACU RN and Post -op Vital signs reviewed and stable  Post vital signs: Reviewed and stable  Complications: No apparent anesthesia complications

## 2014-02-18 NOTE — Preoperative (Signed)
Beta Blockers   Reason not to administer Beta Blockers:Not Applicable 

## 2014-02-18 NOTE — Discharge Instructions (Signed)
[facility] ______[facility] LAPAROSCOPIC SURGERY: POST OP INSTRUCTIONS Always review your discharge instruction sheet given to you by the facility where your surgery was performed. IF YOU HAVE DISABILITY OR FAMILY LEAVE FORMS, YOU MUST BRING THEM TO THE OFFICE FOR PROCESSING.   DO NOT GIVE THEM TO YOUR DOCTOR.  1. A prescription for pain medication may be given to you upon discharge.  Take your pain medication as prescribed, if needed.  If narcotic pain medicine is not needed, then you may take acetaminophen (Tylenol) or ibuprofen (Advil) as needed. 2. Take your usually prescribed medications unless otherwise directed. 3. If you need a refill on your pain medication, please contact your pharmacy.  They will contact our office to request authorization. Prescriptions will not be filled after 5pm or on week-ends. 4. You should follow a light diet the first few days after arrival home, such as soup and crackers, etc.  Be sure to include lots of fluids daily. 5. Most patients will experience some swelling and bruising in the area of the incisions.  Ice packs will help.  Swelling and bruising can take several days to resolve.  6. It is common to experience some constipation if taking pain medication after surgery.  Increasing fluid intake and taking a stool softener (such as Colace) will usually help or prevent this problem from occurring.  A mild laxative (Milk of Magnesia or Miralax) should be taken according to package instructions if there are no bowel movements after 48 hours. 7. Unless discharge instructions indicate otherwise, you may remove your bandages 72 hours after surgery, and you may shower at that time.  You may have steri-strips (small skin tapes) in place directly over the incision.  These strips should be left on the skin for 14 days.  If your surgeon used skin glue on the incision, you may shower in 24 hours.  The glue will flake off over the next 2-3 weeks.  Any sutures or  staples will be removed at the office during your follow-up visit. 8. ACTIVITIES:  You may resume regular (light) daily activities beginning the next day--such as daily self-care, walking, climbing stairs--gradually increasing activities as tolerated.  You may have sexual intercourse when it is comfortable.  Refrain from any heavy lifting or straining-nothing over 10 pounds for 2 weeks. a. You may drive when you are no longer taking prescription pain medication, you can comfortably wear a seatbelt, and you can safely maneuver your car and apply brakes. b. 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 You should see your doctor in the office for a follow-up appointment approximately 2-3 weeks after your surgery.  Make sure that you call for this appointment within a day or two after you arrive home to insure a convenient appointment time. 10. OTHER INSTRUCTIONS: __________________________________________________________________________________________________________________________ __________________________________________________________________________________________________________________________ WHEN TO CALL YOUR DOCTOR: 1. Fever over 101.0 2. Inability to urinate 3. Continued bleeding from incision. 4. Increased pain, redness, or drainage from the incision. 5. Increasing abdominal pain  The clinic staff is available to answer your questions during regular business hours.  Please dont hesitate to call and ask to speak to one of the nurses for clinical concerns.  If you have a medical emergency, go to the nearest emergency room or call 911.  A surgeon from [facility] is always on call at the hospital. [address] [place], [place], [place]  [address] ? [address], [place], [place]   [address] (  [phone] ? [phone] ? FAX [phone] Web site: [email or web address]

## 2014-02-18 NOTE — H&P (View-Only) (Signed)
Patient ID: Sheryl Schmidt, female   DOB: 21-Jan-1951, 63 y.o.   MRN: [id number]  Chief Complaint  Patient presents with  . New Evaluation    eval GB    HPI Sheryl Schmidt is a 63 y.o. female.   HPI  She is self-referred. She's been having intermittent episodes of right upper quadrant pain that radiated to the epigastrium. These occur at night. Recently they have become more severe and then radiated to the back.  The last 2 have followed her eating ranch dressing or using olive oil to her vegetables in. Her last one was associated with nausea and vomiting. She saw her primary care physician who ordered an ultrasound demonstrated a 2 cm gallstone.  This was in the gallbladder neck. It is mobile. Common bile duct diameter 2.7 mm. There is a small cyst in the left lobe of the liver.  She does have a family history of gallbladder disease in that her daughter had a cholecystectomy.  She presents here for further evaluation and treatment.  History reviewed. No pertinent past medical history.  Past Surgical History  Procedure Laterality Date  . Back surgery  2007    removed L1 and removed a rib    Family History  Problem Relation Age of Onset  . COPD Father     Social History History  Substance Use Topics  . Smoking status: Never Smoker   . Smokeless tobacco: Never Used  . Alcohol Use: Yes     Comment: occassional    Allergies  Allergen Reactions  . Sulfonamide Derivatives     REACTION: rash    Current Outpatient Prescriptions  Medication Sig Dispense Refill  . Calcium Carbonate-Vitamin D (CALCIUM-VITAMIN D) 500-200 MG-UNIT per tablet Take 1 tablet by mouth daily.      Marland Kitchen losartan (COZAAR) 25 MG tablet Take 25 mg by mouth daily.      . Multiple Vitamin (MULTIVITAMIN) tablet Take 1 tablet by mouth daily.      . Omega-3 Krill Oil 1000 MG CAPS Take by mouth.       No current facility-administered medications for this visit.    Review of Systems Review of Systems   Constitutional: Negative for fever and chills.  Respiratory: Negative.   Cardiovascular: Negative.   Gastrointestinal: Positive for nausea, vomiting and abdominal pain. Negative for diarrhea.  Genitourinary: Negative.   Musculoskeletal: Negative.   Neurological: Negative.   Hematological: Negative.     Blood pressure 102/70, pulse 70, temperature 97.8 F (36.6 C), resp. rate 14, height 5' (1.524 m), weight 157 lb (71.215 kg).  Physical Exam Physical Exam  Constitutional: No distress.  Overweight female.  HENT:  Head: Normocephalic and atraumatic.  Eyes: No scleral icterus.  Neck: Neck supple.  Cardiovascular: Normal rate and regular rhythm.   Pulmonary/Chest: Effort normal and breath sounds normal.  Abdominal: Soft. She exhibits no distension and no mass. There is no tenderness.  Musculoskeletal: She exhibits no edema.  Lymphadenopathy:    She has no cervical adenopathy.  Neurological: She is alert.  Skin: Skin is warm and dry.  Psychiatric: She has a normal mood and affect. Her behavior is normal.    Data Reviewed Ultrasound report.  Assessment    Symptomatic cholelithiasis.     Plan    Laparoscopic possible open cholecystectomy with cholangiogram. Strict low fat diet.  I have explained the procedure, risks, and aftercare of cholecystectomy.  Risks include but are not limited to bleeding, infection, wound problems, anesthesia, diarrhea, bile leak, injury  to common bile duct/liver/intestine.  She seems to understand and agrees to proceed.         Deliliah Spranger J 02/10/2014, 12:08 PM    

## 2014-02-18 NOTE — Op Note (Signed)
Preoperative diagnosis:  Symptomatic cholelithiasis  Postoperative diagnosis:  Subacute calculous cholecystitis  Procedure: Laparoscopic cholecystectomy with cholangiogram.  Surgeon: Jackolyn Confer, M.D.  Asst.:  Armandina Gemma, M.D.  Anesthesia: General  Indication:   This is a 63 year old female who has been having intermittent episodes of biliary colic type pain.  The episodes have increased in frequency and severity. Ultrasound demonstrates gallstones in the neck of the gallbladder is mobile. She now presents for elective cholecystectomy.  Technique: She was brought to the operating room, placed supine on the operating table, and a general anesthetic was administered.  The abdominal wall was then sterilely prepped and draped. Local anesthetic (Marcaine) was infiltrated in the subumbilical region. A small subumbilical incision was made through the skin, subcutaneous tissue, fascia, and peritoneum entering the peritoneal cavity under direct vision. A pursestring suture of 0 Vicryl was placed around the edges of the fascia. A Hassan trocar was introduced into the peritoneal cavity and a pneumoperitoneum was created by insufflation of carbon dioxide gas. The laparoscope was introduced into the trocar and no underlying bleeding or organ injury was noted. She was then placed in the reverse Trendelenburg position with the right side tilted slightly up.  Three 5 mm trocars were then placed into the abdominal cavity under laparoscopic vision. One in the epigastric area, and 2 in the right upper quadrant area. The gallbladder was visualized and noted to be somewhat firm, moderately inflamed, and partially intrahepatic. There were adhesions between the transverse colon and omentum and the gallbladder which were freed up bluntly. The gallbladder fundus was grasped and retracted toward the right shoulder.  The infundibulum was mobilized with dissection close to the gallbladder and retracted laterally. The  cystic duct was identified and a window was created around it. The cystic artery was also identified and a window was created around it. The critical view was achieved. A clip was placed at the neck of the gallbladder. A small incision was made in the cystic duct. A cholangiocatheter was introduced through the anterior abdominal wall and placed in the cystic duct. A intraoperative cholangiogram was then performed.  Under real-time fluoroscopy, dilute contrast was injected into the cystic duct.  The common hepatic duct, the right and left hepatic ducts, and the common duct were all visualized. Contrast drained into the duodenum without obvious evidence of any obstructing ductal lesion. The final report is pending the Radiologist's interpretation.  The cholangiocatheter was removed, the cystic duct was clipped 3 times on the biliary side, and then the cystic duct was divided sharply. No bile leak was noted from the cystic duct stump.  The cystic artery was then clipped and divided. Following this the gallbladder was dissected free from the liver using electrocautery. Bleeding was controlled with electrocautery during the dissection. The gallbladder was then placed in a retrieval bag and removed from the abdominal cavity through the subumbilical incision.  The gallbladder fossa was inspected, irrigated, and small bleeding sites were controlled with electrocautery. Inspection showed that hemostasis was adequate and there was no evidence of bile leak.  A piece of Surgicel was placed in the gallbladder fossa.  The irrigation fluid was evacuated as much as possible.  The subumbilical trocar was removed and the fascial defect was closed by tightening and tying down the pursestring suture under laparoscopic vision.  The remaining trocars were removed and the pneumoperitoneum was released. The skin incisions were closed with 4-0 Monocryl subcuticular stitches. Steri-Strips and sterile dressings were  applied.  The procedure was well-tolerated  without any apparent complications. She was taken to the recovery room in satisfactory condition.

## 2014-02-18 NOTE — Anesthesia Postprocedure Evaluation (Signed)
  Anesthesia Post-op Note  Patient: Sheryl Schmidt  Procedure(s) Performed: Procedure(s) (LRB): LAPAROSCOPIC CHOLECYSTECTOMY WITH INTRAOPERATIVE CHOLANGIOGRAM (N/A)  Patient Location: PACU  Anesthesia Type: General  Level of Consciousness: awake and alert   Airway and Oxygen Therapy: Patient Spontanous Breathing  Post-op Pain: mild  Post-op Assessment: Post-op Vital signs reviewed, Patient's Cardiovascular Status Stable, Respiratory Function Stable, Patent Airway and No signs of Nausea or vomiting  Last Vitals:  Filed Vitals:   02/18/14 1126  BP: 139/73  Pulse: 72  Temp: 36.3 C  Resp: 20    Post-op Vital Signs: stable   Complications: No apparent anesthesia complications

## 2014-02-18 NOTE — Interval H&P Note (Signed)
History and Physical Interval Note:  02/18/2014 7:19 AM  Sheryl Schmidt  has presented today for surgery, with the diagnosis of cholelithoisis  The various methods of treatment have been discussed with the patient and family. After consideration of risks, benefits and other options for treatment, the patient has consented to  Procedure(s): LAPAROSCOPIC CHOLECYSTECTOMY WITH INTRAOPERATIVE CHOLANGIOGRAM (N/A) as a surgical intervention .  The patient's history has been reviewed, patient examined, no change in status, stable for surgery.  I have reviewed the patient's chart and labs.  Questions were answered to the patient's satisfaction.     Sheryl Schmidt [name]

## 2014-02-22 ENCOUNTER — Encounter (HOSPITAL_COMMUNITY): Payer: Self-pay | Admitting: General Surgery

## 2014-03-08 ENCOUNTER — Encounter (INDEPENDENT_AMBULATORY_CARE_PROVIDER_SITE_OTHER): Payer: Self-pay | Admitting: General Surgery

## 2014-03-08 ENCOUNTER — Ambulatory Visit (INDEPENDENT_AMBULATORY_CARE_PROVIDER_SITE_OTHER): Payer: BC Managed Care – PPO | Admitting: General Surgery

## 2014-03-08 VITALS — BP 124/80 | HR 77 | Temp 97.8°F | Ht 63.0 in | Wt 154.0 lb

## 2014-03-08 DIAGNOSIS — Z4889 Encounter for other specified surgical aftercare: Secondary | ICD-10-CM

## 2014-03-08 NOTE — Patient Instructions (Signed)
Resume normal activities. Low-fat diet recommended.

## 2014-03-08 NOTE — Progress Notes (Signed)
She is here for a postop visit following laparoscopic cholecystectomy.  Diet is being tolerated, bowels are moving.  No problems with incisions.  Pathology demonstrates chronic cholecystitis and cholelithiasis. She was given a copy of this report.  PE:  ABD:  Soft, incisions clean/dry/intact and solid.  Assessment:  Doing well postop.  Plan:  Lowfat diet recommended.  Activities as tolerated.  Return visit prn.

## 2014-03-19 ENCOUNTER — Ambulatory Visit: Payer: BC Managed Care – PPO | Admitting: Internal Medicine

## 2014-07-14 ENCOUNTER — Other Ambulatory Visit: Payer: Self-pay | Admitting: Obstetrics and Gynecology

## 2014-07-14 DIAGNOSIS — M949 Disorder of cartilage, unspecified: Principal | ICD-10-CM

## 2014-07-14 DIAGNOSIS — M8580 Other specified disorders of bone density and structure, unspecified site: Secondary | ICD-10-CM

## 2014-07-14 DIAGNOSIS — M899 Disorder of bone, unspecified: Secondary | ICD-10-CM

## 2014-07-26 ENCOUNTER — Ambulatory Visit
Admission: RE | Admit: 2014-07-26 | Discharge: 2014-07-26 | Disposition: A | Discharge status: Home / Self Care | Payer: BC Managed Care – PPO | Source: Ambulatory Visit | Attending: Obstetrics and Gynecology | Admitting: Obstetrics and Gynecology

## 2014-07-26 DIAGNOSIS — M899 Disorder of bone, unspecified: Secondary | ICD-10-CM

## 2014-07-26 DIAGNOSIS — M949 Disorder of cartilage, unspecified: Principal | ICD-10-CM

## 2014-07-26 DIAGNOSIS — M8580 Other specified disorders of bone density and structure, unspecified site: Secondary | ICD-10-CM

## 2014-11-03 ENCOUNTER — Other Ambulatory Visit: Payer: Self-pay

## 2014-11-03 DIAGNOSIS — Z1231 Encounter for screening mammogram for malignant neoplasm of breast: Secondary | ICD-10-CM

## 2014-11-24 ENCOUNTER — Ambulatory Visit
Admission: RE | Admit: 2014-11-24 | Discharge: 2014-11-24 | Disposition: A | Discharge status: Home / Self Care | Payer: BC Managed Care – PPO | Source: Ambulatory Visit

## 2014-11-24 DIAGNOSIS — Z1231 Encounter for screening mammogram for malignant neoplasm of breast: Secondary | ICD-10-CM

## 2015-06-09 IMAGING — US US ABDOMEN COMPLETE
1 series · 14 of 25 positions shown · non-contrast
Comparison: none

[Series 1: us abdomen complete · 0.22mm/px · 14 of 91 slices shown]
[im 1/91]
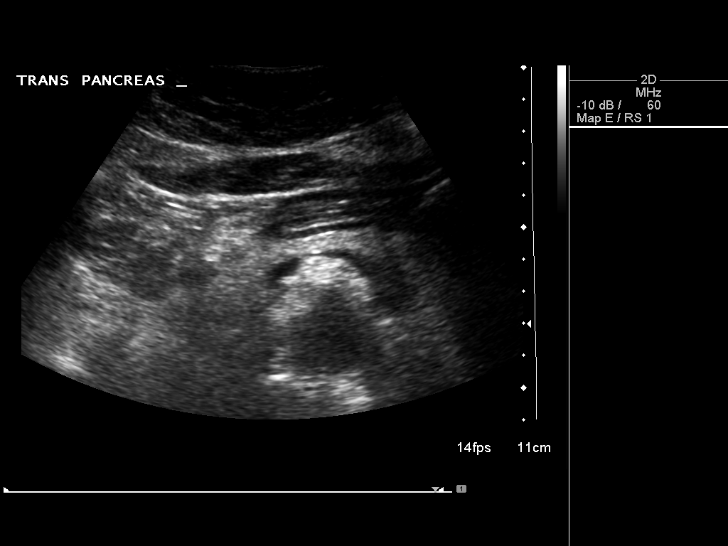
[im 8/91]
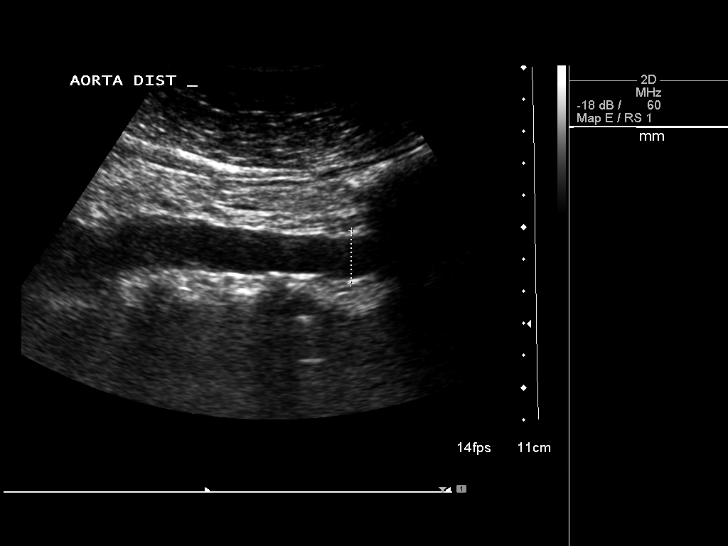
[im 16/91]
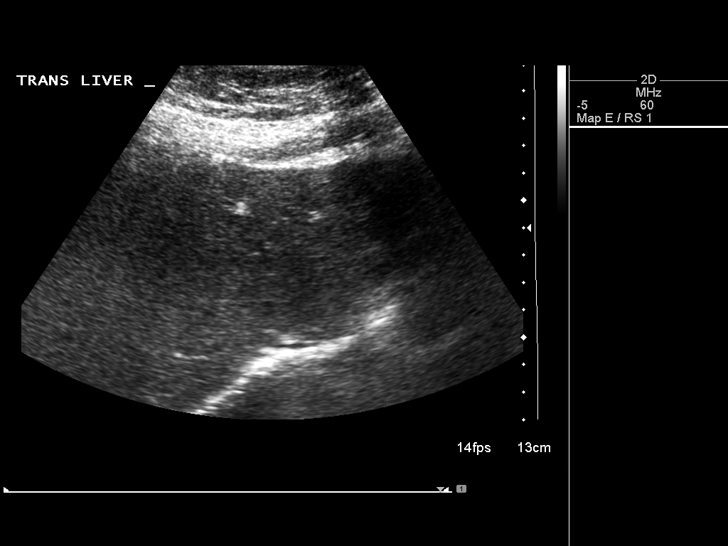
[im 23/91]
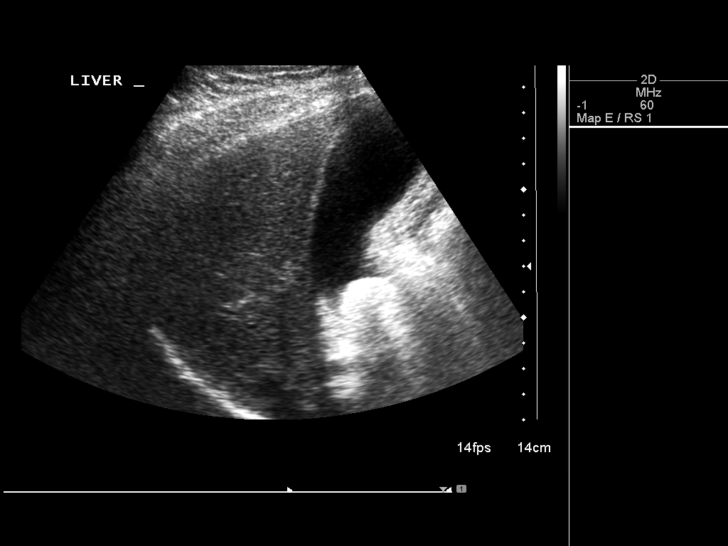
[im 31/91]
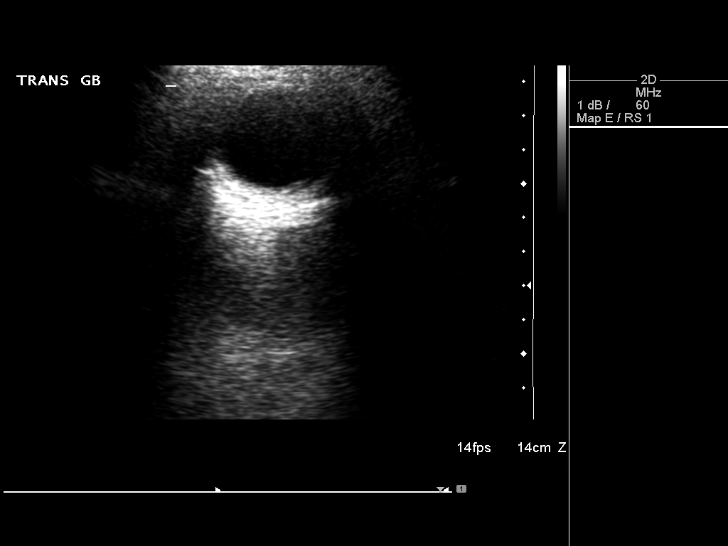
[im 34/91]
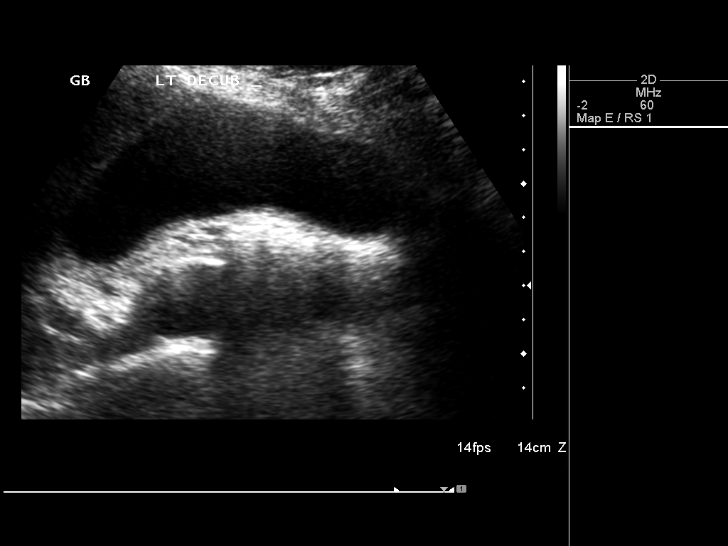
[im 42/91]
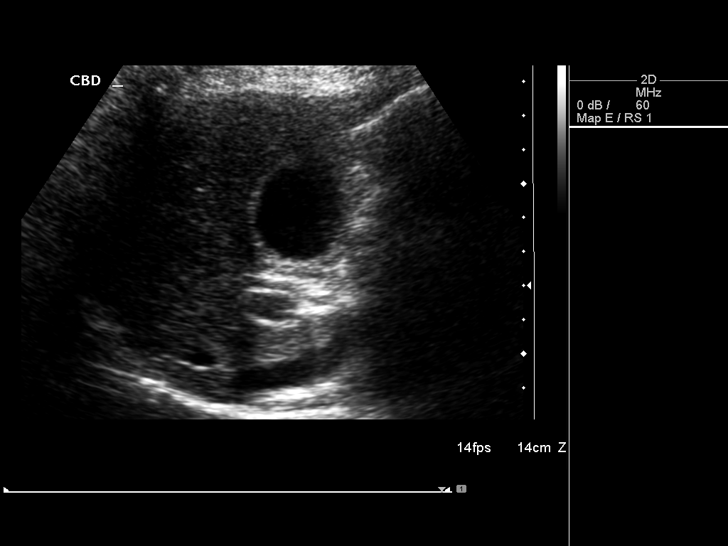
[im 49/91]
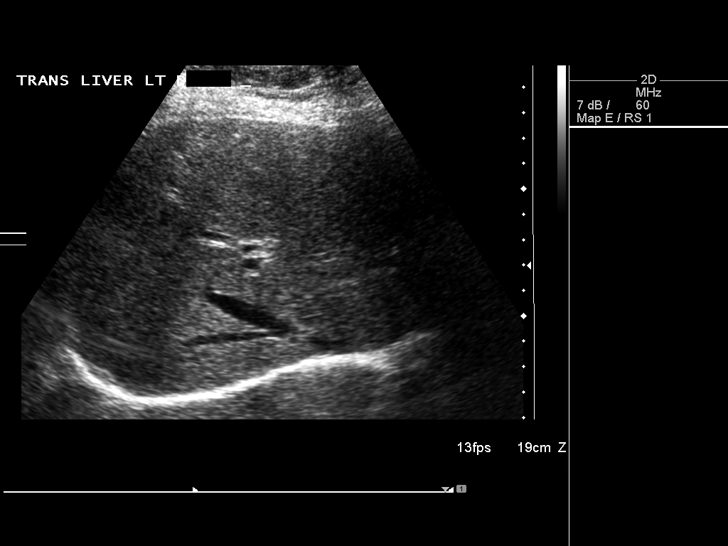
[im 57/91]
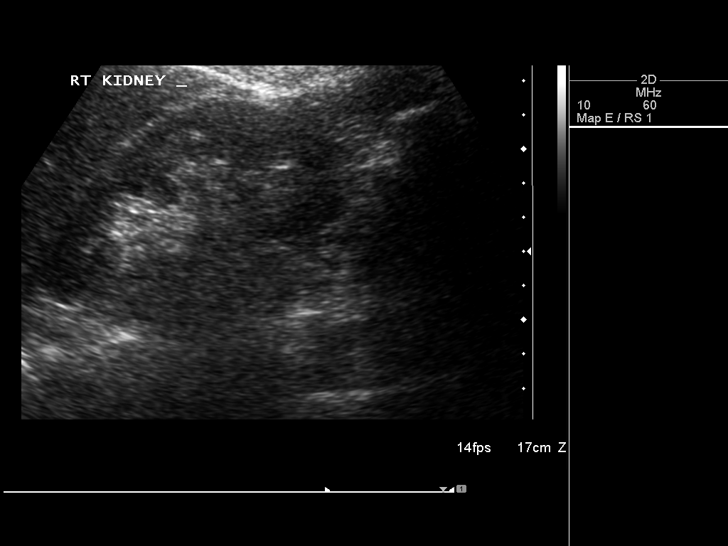
[im 61/91]
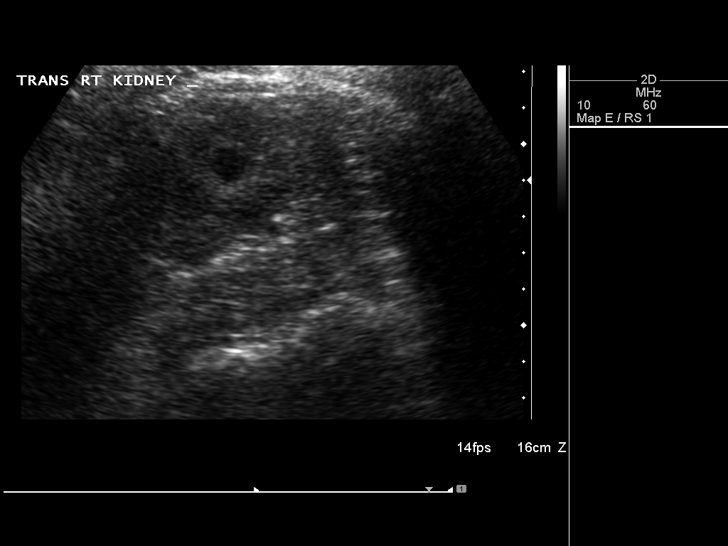
[im 68/91]
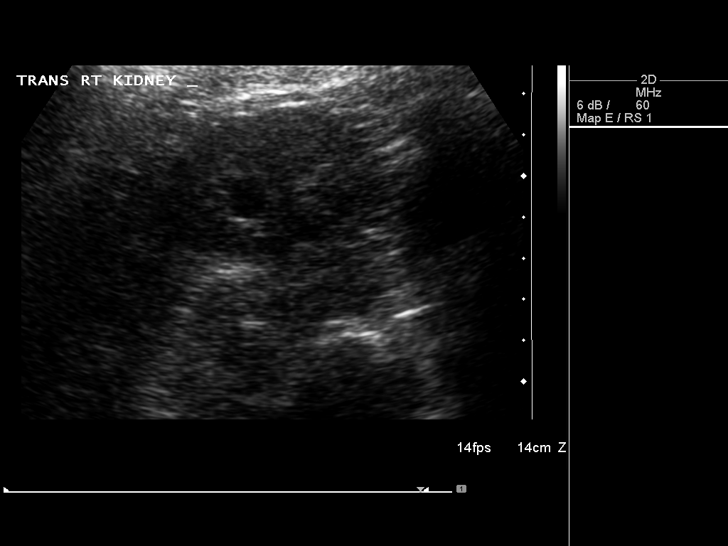
[im 76/91]
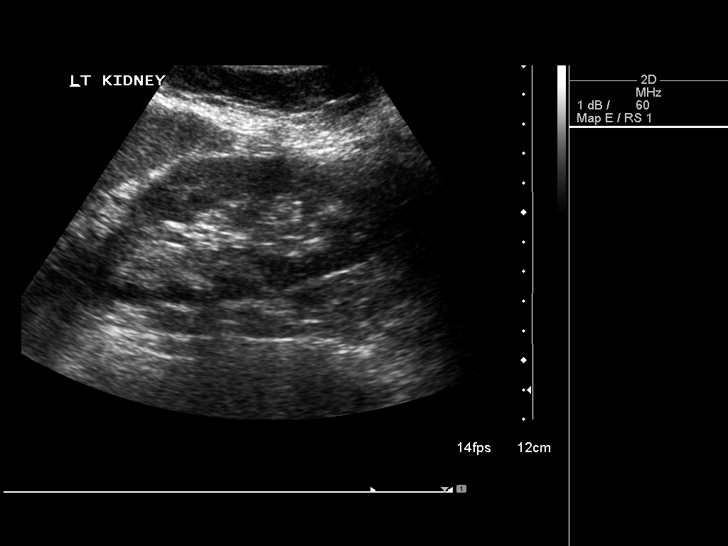
[im 83/91]
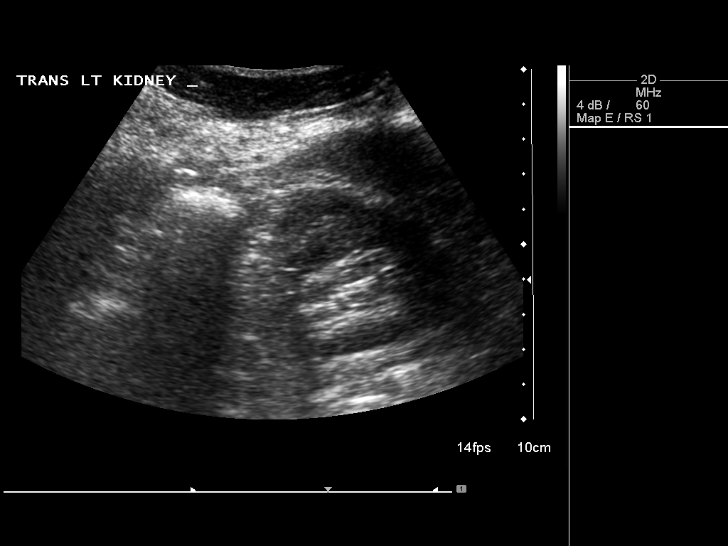
[im 91/91]
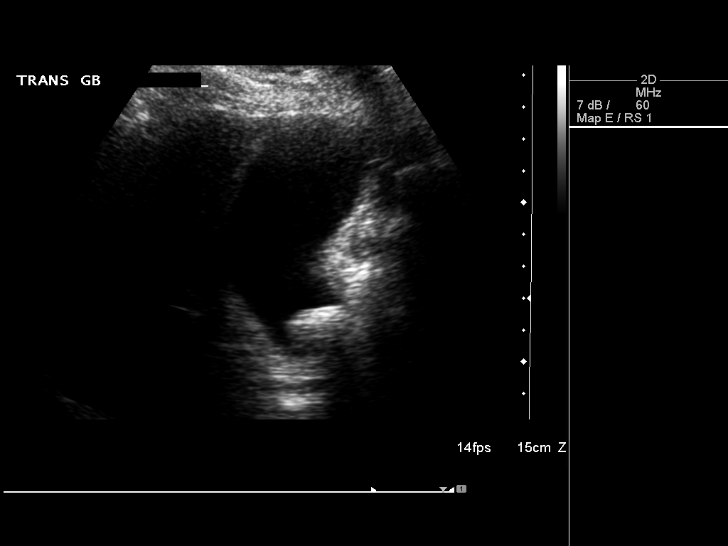

[14 of 25 positions shown; findings below may reference images not displayed]

CLINICAL DATA
Acute abdomen pain

EXAM
ULTRASOUND ABDOMEN COMPLETE

COMPARISON
None.

FINDINGS
Gallbladder:

There is a 2 cm non mobile stone in the gallbladder neck. No
sonographic Murphy sign noted.

Common bile duct:

Diameter: 2.7 mm

Liver:

There is a 1.3 x 1 x 1.4 cm cyst in the left lobe liver. Within
normal limits in parenchymal echogenicity.

IVC:

Poorly visualized due to bowel gas.

Pancreas:

Visualized portion unremarkable.

Spleen:

Size and appearance within normal limits.

Right Kidney:

Length: 10 cm. Echogenicity within normal limits. No hydronephrosis
visualized. There is a 1.1 x 1.1 x 1.1 cm cyst in the right kidney.

Left Kidney:

Length: 11.1 cm. Echogenicity within normal limits. No mass or
hydronephrosis visualized.

Abdominal aorta:

No aneurysm visualized.

Other findings:

None.

IMPRESSION
Cholelithiasis without sonographic evidence of acute cholecystitis.
Liver and right kidney cysts.

SIGNATURE

## 2015-08-17 ENCOUNTER — Encounter: Payer: Self-pay | Admitting: Podiatry

## 2015-08-17 ENCOUNTER — Ambulatory Visit (INDEPENDENT_AMBULATORY_CARE_PROVIDER_SITE_OTHER): Payer: BLUE CROSS/BLUE SHIELD | Admitting: Podiatry

## 2015-08-17 VITALS — BP 117/69 | HR 72 | Resp 16 | Ht 63.0 in | Wt 157.0 lb

## 2015-08-17 DIAGNOSIS — B351 Tinea unguium: Secondary | ICD-10-CM

## 2015-08-17 DIAGNOSIS — M2040 Other hammer toe(s) (acquired), unspecified foot: Secondary | ICD-10-CM

## 2015-08-17 DIAGNOSIS — L600 Ingrowing nail: Secondary | ICD-10-CM

## 2015-08-17 NOTE — Progress Notes (Signed)
   Subjective:    Patient ID: Sheryl Schmidt, female    DOB: 14-Apr-1951, 64 y.o.   MRN: [id number]  HPI Patient presents with a loose nail on their right foot, great toe. Pt stated, "happened 3 1/2 weeks ago; wheelbarrow hit toe".   Review of Systems  All other systems reviewed and are negative.      Objective:   Physical Exam        Assessment & Plan:

## 2015-08-17 NOTE — Progress Notes (Signed)
Subjective:     Patient ID: Sheryl Schmidt, female   DOB: 1950/12/10, 64 y.o.   MRN: [id number]  HPI patient states I traumatized my big toenail around 3-1/2 weeks ago and it's given me trouble and I'm supposed to go out of town   Review of Systems  All other systems reviewed and are negative.      Objective:   Physical Exam  Constitutional: She is oriented to person, place, and time.  Cardiovascular: Intact distal pulses.   Musculoskeletal: Normal range of motion.  Neurological: She is oriented to person, place, and time.  Skin: Skin is warm.  Nursing note and vitals reviewed.  neurovascular status intact muscle strength adequate range of motion within normal limits with patient having a damaged right hallux nail that is moderately loose and has lost the distal two thirds of the nailbed. 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 Were focusing on the nail and today I infiltrated 60 mg Xylocaine Marcaine mixture and remove the remainder of the nail and applied sterile dressing. We will allow a new nail to regrow and it may grow out normally or give her problems

## 2015-08-17 NOTE — Patient Instructions (Signed)

## 2015-08-18 ENCOUNTER — Telehealth: Payer: Self-pay | Admitting: *Deleted

## 2015-08-18 NOTE — Telephone Encounter (Signed)
Left message at [phone] (Cell #) to check to see how they were doing from their ingrown toenail procedure that was performed on Wednesday, August 17, 2015. Waiting for a response.

## 2015-10-18 ENCOUNTER — Other Ambulatory Visit: Payer: Self-pay

## 2015-10-18 DIAGNOSIS — Z1231 Encounter for screening mammogram for malignant neoplasm of breast: Secondary | ICD-10-CM

## 2015-12-02 ENCOUNTER — Ambulatory Visit
Admission: RE | Admit: 2015-12-02 | Discharge: 2015-12-02 | Disposition: A | Discharge status: Home / Self Care | Payer: BLUE CROSS/BLUE SHIELD | Source: Ambulatory Visit

## 2015-12-02 DIAGNOSIS — Z1231 Encounter for screening mammogram for malignant neoplasm of breast: Secondary | ICD-10-CM

## 2016-01-11 ENCOUNTER — Encounter: Payer: Self-pay | Admitting: Internal Medicine

## 2016-02-10 ENCOUNTER — Encounter: Payer: Self-pay | Admitting: Gastroenterology

## 2016-08-07 ENCOUNTER — Other Ambulatory Visit: Payer: Self-pay | Admitting: Internal Medicine

## 2016-08-07 DIAGNOSIS — G441 Vascular headache, not elsewhere classified: Secondary | ICD-10-CM

## 2016-08-16 ENCOUNTER — Ambulatory Visit
Admission: RE | Admit: 2016-08-16 | Discharge: 2016-08-16 | Disposition: A | Discharge status: Home / Self Care | Payer: BLUE CROSS/BLUE SHIELD | Source: Ambulatory Visit | Attending: Internal Medicine | Admitting: Internal Medicine

## 2016-08-16 DIAGNOSIS — G441 Vascular headache, not elsewhere classified: Secondary | ICD-10-CM

## 2016-08-16 MED ORDER — GADOBENATE DIMEGLUMINE 529 MG/ML IV SOLN
15.0000 mL | Freq: Once | INTRAVENOUS | Status: AC | PRN
  Administered 2016-08-16: 15 mL via INTRAVENOUS

## 2016-10-17 ENCOUNTER — Ambulatory Visit (INDEPENDENT_AMBULATORY_CARE_PROVIDER_SITE_OTHER): Payer: BLUE CROSS/BLUE SHIELD | Admitting: Neurology

## 2016-10-17 ENCOUNTER — Encounter: Payer: Self-pay | Admitting: Neurology

## 2016-10-17 VITALS — BP 123/81 | HR 82 | Ht 63.0 in | Wt 152.5 lb

## 2016-10-17 DIAGNOSIS — R9089 Other abnormal findings on diagnostic imaging of central nervous system: Secondary | ICD-10-CM | POA: Diagnosis not present

## 2016-10-17 NOTE — Progress Notes (Signed)
Reason for visit: Abnormal MRI brain  Referring physician: Dr. Rockie Neighbours Sheryl Schmidt is a 65 y.o. female  History of present illness:  Sheryl Schmidt is a 66 year old right-handed white female with a history of hypertension. The patient claims that she has been treated for high blood pressure for about 15 years, but for several years after the diagnosis of hypertension she hesitated on going on medications for blood pressure. The patient does not believe that her blood pressures ever were extremely elevated, but her diastolic blood pressures were running in the 90 to 100 range at times. The patient began having some right parietal headaches about 2 months ago. The patient describes a pressure sensation in the right parietal region, unassociated with any symptoms such as photophobia, phonophobia, nausea or vomiting. The patient denied any numbness or weakness of the extremities or any problems with confusion or decreased concentration. The patient will get 2 or 3 headaches a week, she will take Advil for the headache. MRI of the brain was done because of the headaches, the patient was found to have relatively extensive white matter changes involving the hemispheres bilaterally. The patient has not been on aspirin therapy. The headaches have improved, but the patient comes to this office for an evaluation. The patient indicates that she has undergone a carotid Doppler study and a 2-D echocardiogram relatively recently, she was told that the studies were unremarkable. These studies were done by Dr. Claudie Leach. The patient denies any balance issues or difficulty controlling the bowels or the bladder.   Past Medical History:  Diagnosis Date  . Cholelithiasis   . Hypertension   . Internal hemorrhoid     Past Surgical History:  Procedure Laterality Date  . BACK SURGERY  2011   removed L1 and removed a rib  . CESAREAN SECTION    . CHOLECYSTECTOMY N/A 02/18/2014   Procedure: LAPAROSCOPIC  CHOLECYSTECTOMY WITH INTRAOPERATIVE CHOLANGIOGRAM;  Surgeon: Odis Hollingshead, MD;  Location: [facility];  Service: General;  Laterality: N/A;    Family History  Problem Relation Age of Onset  . COPD Father   . Heart disease Father   . Colon cancer Maternal Uncle   . Diabetes      Social history:  reports that she has never smoked. She has never used smokeless tobacco. She reports that she drinks alcohol. She reports that she does not use drugs.  Medications:  Prior to Admission medications   Medication Sig Start Date End Date Taking? Authorizing Provider  Ascorbic Acid (VITAMIN C) 1000 MG tablet Take 1,000 mg by mouth daily.   Yes Historical Provider, MD  cholecalciferol (VITAMIN D) 1000 UNITS tablet Take 1,000 Units by mouth daily.   Yes Historical Provider, MD  Coenzyme Q10 (COQ10) 200 MG CAPS Take 1 capsule by mouth daily.   Yes Historical Provider, MD  ibuprofen (ADVIL,MOTRIN) 200 MG tablet Take 400 mg by mouth every 6 (six) hours as needed for mild pain or moderate pain.   Yes Historical Provider, MD  losartan (COZAAR) 50 MG tablet Take 50 mg by mouth every morning.   Yes Historical Provider, MD  MAGNESIUM PO Take 250 mg by mouth daily.   Yes Historical Provider, MD  Multiple Vitamin (MULTIVITAMIN) tablet Take 1 tablet by mouth daily.   Yes Historical Provider, MD  Omega-3 Krill Oil 1000 MG CAPS Take 1 capsule by mouth daily.    Yes Historical Provider, MD  TURMERIC CURCUMIN PO Take 900 mg by mouth 2 (two) times daily.  Yes Historical Provider, MD      Allergies  Allergen Reactions  . Sulfonamide Derivatives     REACTION: rash    ROS:  Out of a complete 14 system review of symptoms, the patient complains only of the following symptoms, and all other reviewed systems are negative.  Headache  Blood pressure 123/81, pulse 82, height 5\' 3"  (1.6 m), weight 152 lb 8 oz (69.2 kg).  Physical Exam  General: The patient is alert and cooperative at the time of the  examination.  Eyes: Pupils are equal, round, and reactive to light. Discs are flat bilaterally.  Neck: The neck is supple, no carotid bruits are noted.  Respiratory: The respiratory examination is clear.  Cardiovascular: The cardiovascular examination reveals a regular rate and rhythm, no obvious murmurs or rubs are noted.  Skin: Extremities are without significant edema.  Neurologic Exam  Mental status: The patient is alert and oriented x 3 at the time of the examination. The patient has apparent normal recent and remote memory, with an apparently normal attention span and concentration ability.  Cranial nerves: Facial symmetry is present. There is good sensation of the face to pinprick and soft touch bilaterally. The strength of the facial muscles and the muscles to head turning and shoulder shrug are normal bilaterally. Speech is well enunciated, no aphasia or dysarthria is noted. Extraocular movements are full. Visual fields are full. The tongue is midline, and the patient has symmetric elevation of the soft palate. No obvious hearing deficits are noted.  Motor: The motor testing reveals 5 over 5 strength of all 4 extremities. Good symmetric motor tone is noted throughout.  Sensory: Sensory testing is intact to pinprick, soft touch, vibration sensation, and position sense on all 4 extremities. No evidence of extinction is noted.  Coordination: Cerebellar testing reveals good finger-nose-finger and heel-to-shin bilaterally.  Gait and station: Gait is normal. Tandem gait is normal. Romberg is negative. No drift is seen.  Reflexes: Deep tendon reflexes are symmetric and normal bilaterally. Toes are downgoing bilaterally.   Assessment/Plan:  1. Abnormal MRI brain, small vessel disease  2. History of hypertension  Given the recent onset of headache, the patient will be sent for further blood work today. The changes seen by MRI are most consistent with small vessel disease, not  demyelinating disease. The patient will go on low-dose aspirin, 81 mg daily. Her blood pressures have been relatively well controlled recently. The patient will have the reports of the 2-D echocardiogram and carotid Doppler study sent to our office. I will contact the patient regarding the results of the blood work.  Jill Alexanders MD 10/17/2016 8:55 AM  [facility] [address] [place], [place] [address]  Phone [phone] Fax [phone]

## 2016-10-17 NOTE — Patient Instructions (Signed)
   Start aspirin 81 mg daily

## 2016-10-22 ENCOUNTER — Telehealth: Payer: Self-pay | Admitting: Neurology

## 2016-10-22 NOTE — Telephone Encounter (Signed)
I have received medical records concerning this patient. An echocardiogram done on this 7 of April 2017 was a normal study. A carotid Doppler study done in October 2015 showed less than 39% carotid stenosis bilaterally.

## 2016-10-23 LAB — LUPUS ANTICOAGULANT
Dilute Viper Venom Time: 36 s (ref 0.0–47.0)
PTT Lupus Anticoagulant: 31.8 s (ref 0.0–51.9)
Thrombin Time: 20.4 s (ref 0.0–23.0)
dPT Confirm Ratio: 1.24 Ratio (ref 0.00–1.40)
dPT: 46.7 s (ref 0.0–55.0)

## 2016-10-23 LAB — ANA W/REFLEX: Anti Nuclear Antibody(ANA): NEGATIVE

## 2016-10-23 LAB — PAN-ANCA
ANCA Proteinase 3: <3.5 U/mL (ref 0.0–3.5)
Atypical pANCA: <1:20 {titer}
C-ANCA: <1:20 {titer}
Myeloperoxidase Ab: <9 U/mL (ref 0.0–9.0)
P-ANCA: <1:20 {titer}

## 2016-10-23 LAB — FACTOR 5 LEIDEN

## 2016-10-23 LAB — SEDIMENTATION RATE: Sed Rate: 2 mm/hr (ref 0–40)

## 2016-10-24 ENCOUNTER — Other Ambulatory Visit: Payer: Self-pay | Admitting: Obstetrics and Gynecology

## 2016-10-24 DIAGNOSIS — Z1231 Encounter for screening mammogram for malignant neoplasm of breast: Secondary | ICD-10-CM

## 2016-12-04 ENCOUNTER — Ambulatory Visit
Admission: RE | Admit: 2016-12-04 | Discharge: 2016-12-04 | Disposition: A | Discharge status: Home / Self Care | Payer: BLUE CROSS/BLUE SHIELD | Source: Ambulatory Visit | Attending: Obstetrics and Gynecology | Admitting: Obstetrics and Gynecology

## 2016-12-04 DIAGNOSIS — Z1231 Encounter for screening mammogram for malignant neoplasm of breast: Secondary | ICD-10-CM

## 2017-10-22 ENCOUNTER — Other Ambulatory Visit: Payer: Self-pay | Admitting: Obstetrics and Gynecology

## 2017-11-01 ENCOUNTER — Other Ambulatory Visit: Payer: Self-pay | Admitting: Obstetrics and Gynecology

## 2017-11-01 DIAGNOSIS — R5381 Other malaise: Secondary | ICD-10-CM

## 2017-11-06 ENCOUNTER — Other Ambulatory Visit: Payer: Self-pay | Admitting: Obstetrics and Gynecology

## 2017-12-05 ENCOUNTER — Other Ambulatory Visit: Payer: Self-pay | Admitting: Obstetrics and Gynecology

## 2017-12-05 DIAGNOSIS — E2839 Other primary ovarian failure: Secondary | ICD-10-CM

## 2017-12-05 DIAGNOSIS — M8580 Other specified disorders of bone density and structure, unspecified site: Secondary | ICD-10-CM

## 2017-12-05 DIAGNOSIS — Z139 Encounter for screening, unspecified: Secondary | ICD-10-CM

## 2017-12-05 DIAGNOSIS — M899 Disorder of bone, unspecified: Secondary | ICD-10-CM

## 2017-12-05 DIAGNOSIS — M949 Disorder of cartilage, unspecified: Principal | ICD-10-CM

## 2017-12-30 ENCOUNTER — Inpatient Hospital Stay
Admission: RE | Admit: 2017-12-30 | Discharge: 2017-12-30 | Disposition: A | Discharge status: Home / Self Care | Payer: BLUE CROSS/BLUE SHIELD | Source: Ambulatory Visit | Attending: Obstetrics and Gynecology | Admitting: Obstetrics and Gynecology

## 2017-12-30 ENCOUNTER — Ambulatory Visit
Admission: RE | Admit: 2017-12-30 | Discharge: 2017-12-30 | Disposition: A | Discharge status: Home / Self Care | Payer: BLUE CROSS/BLUE SHIELD | Source: Ambulatory Visit | Attending: Obstetrics and Gynecology | Admitting: Obstetrics and Gynecology

## 2017-12-30 DIAGNOSIS — Z139 Encounter for screening, unspecified: Secondary | ICD-10-CM

## 2018-01-06 ENCOUNTER — Ambulatory Visit
Admission: RE | Admit: 2018-01-06 | Discharge: 2018-01-06 | Disposition: A | Discharge status: Home / Self Care | Payer: BLUE CROSS/BLUE SHIELD | Source: Ambulatory Visit | Attending: Obstetrics and Gynecology | Admitting: Obstetrics and Gynecology

## 2018-01-06 DIAGNOSIS — E2839 Other primary ovarian failure: Secondary | ICD-10-CM

## 2018-01-06 DIAGNOSIS — M8580 Other specified disorders of bone density and structure, unspecified site: Secondary | ICD-10-CM

## 2018-01-06 DIAGNOSIS — M899 Disorder of bone, unspecified: Secondary | ICD-10-CM

## 2018-01-06 DIAGNOSIS — M949 Disorder of cartilage, unspecified: Principal | ICD-10-CM

## 2018-10-09 ENCOUNTER — Other Ambulatory Visit: Payer: Self-pay | Admitting: Obstetrics and Gynecology

## 2018-10-09 DIAGNOSIS — Z1231 Encounter for screening mammogram for malignant neoplasm of breast: Secondary | ICD-10-CM

## 2018-11-13 ENCOUNTER — Encounter

## 2018-11-13 ENCOUNTER — Encounter: Payer: Self-pay | Admitting: Neurology

## 2018-11-13 ENCOUNTER — Ambulatory Visit (INDEPENDENT_AMBULATORY_CARE_PROVIDER_SITE_OTHER): Payer: BLUE CROSS/BLUE SHIELD | Admitting: Neurology

## 2018-11-13 VITALS — BP 126/84 | HR 66 | Ht 63.0 in | Wt 166.0 lb

## 2018-11-13 DIAGNOSIS — R9089 Other abnormal findings on diagnostic imaging of central nervous system: Secondary | ICD-10-CM | POA: Diagnosis not present

## 2018-11-13 DIAGNOSIS — I679 Cerebrovascular disease, unspecified: Secondary | ICD-10-CM | POA: Diagnosis not present

## 2018-11-13 NOTE — Progress Notes (Signed)
Reason for visit: History of abnormal MRI brain  Sheryl Schmidt is a 67 y.o. female  History of present illness:  Sheryl Schmidt is a 67 year old right-handed white female who was seen 2 years ago for evaluation of abnormalities on MRI of the brain.  She has fairly extensive white matter changes that were felt to be most consistent with chronic ischemic changes.  The patient does have a history of high blood pressure but she has never had severe hypertension in the past.  Her blood pressures have been well controlled on a single blood pressure agent.  She has not had any clinical manifestations of the cerebrovascular disease, she reports no numbness, weakness, vision changes, speech changes or swallowing problems.  She has not had any balance problems or difficulty controlling the bowels or the bladder.  She does report an occasional throbbing type sensation on the right parietal area, this is not persistent.  She has not had overt headache, no prior history of migraine.  She returns for reevaluation.  The patient remains on low-dose aspirin.  Past Medical History:  Diagnosis Date  . Cholelithiasis   . Hypertension   . Internal hemorrhoid     Past Surgical History:  Procedure Laterality Date  . BACK SURGERY  2011   removed L1 and removed a rib  . CESAREAN SECTION    . CHOLECYSTECTOMY N/A 02/18/2014   Procedure: LAPAROSCOPIC CHOLECYSTECTOMY WITH INTRAOPERATIVE CHOLANGIOGRAM;  Surgeon: Odis Hollingshead, MD;  Location: [facility];  Service: General;  Laterality: N/A;    Family History  Problem Relation Age of Onset  . COPD Father   . Heart disease Father   . Colon cancer Maternal Uncle   . Diabetes Unknown     Social history:  reports that she has never smoked. She has never used smokeless tobacco. She reports current alcohol use. She reports that she does not use drugs.  Medications:  Prior to Admission medications   Medication Sig Start Date End Date Taking? Authorizing Provider   Ascorbic Acid (VITAMIN C) 1000 MG tablet Take 1,000 mg by mouth daily.   Yes [provider]  aspirin 81 MG chewable tablet Chew 81 mg by mouth daily.   Yes [provider]  cholecalciferol (VITAMIN D) 1000 UNITS tablet Take 1,000 Units by mouth daily.   Yes [provider]  Coenzyme Q10 (COQ10) 200 MG CAPS Take 1 capsule by mouth daily.   Yes [provider]  folic acid (FOLVITE) 235 MCG tablet Take 400 mcg by mouth daily.   Yes [provider]  ibuprofen (ADVIL,MOTRIN) 200 MG tablet Take 400 mg by mouth every 6 (six) hours as needed for mild pain or moderate pain.   Yes [provider]  losartan (COZAAR) 50 MG tablet Take 50 mg by mouth every morning.   Yes [provider]  MAGNESIUM PO Take 250 mg by mouth daily.   Yes [provider]  Multiple Vitamin (MULTIVITAMIN) tablet Take 1 tablet by mouth daily.   Yes [provider]  Omega-3 Krill Oil 1000 MG CAPS Take 1 capsule by mouth daily.    Yes [provider]  TURMERIC CURCUMIN PO Take 900 mg by mouth 2 (two) times daily.   Yes [provider]      Allergies  Allergen Reactions  . Sulfonamide Derivatives     REACTION: rash    ROS:  Out of a complete 14 system review of symptoms, the patient complains only of the following  symptoms, and all other reviewed systems are negative.  Throbbing sensation  Blood pressure 126/84, pulse 66, height 5\' 3"  (1.6 m), weight 166 lb (75.3 kg).  Physical Exam  General: The patient is alert and cooperative at the time of the examination.  Eyes: Pupils are equal, round, and reactive to light. Discs are flat bilaterally.  Neck: The neck is supple, no carotid bruits are noted.  Respiratory: The respiratory examination is clear.  Cardiovascular: The cardiovascular examination reveals a regular rate and rhythm, no obvious murmurs or rubs are noted.  Skin: Extremities are without significant  edema.  Neurologic Exam  Mental status: The patient is alert and oriented x 3 at the time of the examination. The patient has apparent normal recent and remote memory, with an apparently normal attention span and concentration ability.  Cranial nerves: Facial symmetry is present. There is good sensation of the face to pinprick and soft touch bilaterally. The strength of the facial muscles and the muscles to head turning and shoulder shrug are normal bilaterally. Speech is well enunciated, no aphasia or dysarthria is noted. Extraocular movements are full. Visual fields are full. The tongue is midline, and the patient has symmetric elevation of the soft palate. No obvious hearing deficits are noted.  Motor: The motor testing reveals 5 over 5 strength of all 4 extremities. Good symmetric motor tone is noted throughout.  Sensory: Sensory testing is intact to pinprick, soft touch, vibration sensation, and position sense on all 4 extremities. No evidence of extinction is noted.  Coordination: Cerebellar testing reveals good finger-nose-finger and heel-to-shin bilaterally.  Gait and station: Gait is normal. Tandem gait is normal. Romberg is negative. No drift is seen.  Reflexes: Deep tendon reflexes are symmetric and normal bilaterally. Toes are downgoing bilaterally.   MRI brain 08/16/16:  IMPRESSION: Extensive white matter signal abnormality, in conjunction with prominent perivascular spaces, and numerous foci of chronic hemorrhage, suggest sequelae of hypertensive cerebrovascular disease.  No acute intracranial findings. No abnormal postcontrast Enhancement.  * MRI scan images were reviewed online. I agree with the written report.    Assessment/Plan:  1.  Abnormal MRI brain, small vessel disease  The patient does have fairly extensive white matter changes of the brain, prior cerebrovascular work-up was negative.  The patient will undergo MRI of the brain to follow-up with a prior  study, if significant changes are noted, further investigation will be required.  The patient will remain on low-dose aspirin.  Jill Alexanders MD 11/13/2018 8:02 AM  [facility] [address] [place], [place] [address]  Phone [phone] Fax [phone]

## 2018-11-17 ENCOUNTER — Telehealth: Payer: Self-pay | Admitting: Neurology

## 2018-11-17 NOTE — Telephone Encounter (Signed)
[employer] Auth: [id number] (exp. 11/17/18 to 12/16/17) spoke to the patient she informed she is going to contact her insurance and see how much it would cost at other places and get back to me

## 2018-11-17 NOTE — Telephone Encounter (Signed)
Patient called in and she is scheduled for 11/19/18 at [facility].

## 2018-11-19 ENCOUNTER — Ambulatory Visit: Payer: BLUE CROSS/BLUE SHIELD

## 2018-11-19 DIAGNOSIS — I679 Cerebrovascular disease, unspecified: Secondary | ICD-10-CM | POA: Diagnosis not present

## 2018-11-19 DIAGNOSIS — R9089 Other abnormal findings on diagnostic imaging of central nervous system: Secondary | ICD-10-CM

## 2018-11-21 ENCOUNTER — Telehealth: Payer: Self-pay | Admitting: Neurology

## 2018-11-21 NOTE — Telephone Encounter (Signed)
  I called the patient.  The MRI of the brain is unchanged from 2017.  The patient is on blood pressure medications and on low-dose aspirin.   MRI brain 10/22/18:  IMPRESSION: This MRI of the brain without contrast shows the following: 1.   Extensive T2/flair hyperintense foci in the hemispheres in a pattern most consistent with chronic microvascular ischemic changes.    There are no significant changes compared to the 08/16/2016 MRI.  A few chronic microhemorrhages noted in 2017 are not readily apparent on the current images using different technique. 2.   There are no acute findings.

## 2018-12-29 ENCOUNTER — Ambulatory Visit
Admission: RE | Admit: 2018-12-29 | Discharge: 2018-12-29 | Disposition: A | Discharge status: Home / Self Care | Payer: BLUE CROSS/BLUE SHIELD | Source: Ambulatory Visit | Attending: Obstetrics and Gynecology | Admitting: Obstetrics and Gynecology

## 2018-12-29 DIAGNOSIS — Z1231 Encounter for screening mammogram for malignant neoplasm of breast: Secondary | ICD-10-CM

## 2019-02-02 ENCOUNTER — Encounter: Payer: Self-pay | Admitting: Gastroenterology

## 2019-03-02 ENCOUNTER — Ambulatory Visit (AMBULATORY_SURGERY_CENTER): Payer: Self-pay

## 2019-03-02 ENCOUNTER — Other Ambulatory Visit: Payer: Self-pay

## 2019-03-02 VITALS — Ht 63.0 in | Wt 155.0 lb

## 2019-03-02 DIAGNOSIS — Z1211 Encounter for screening for malignant neoplasm of colon: Secondary | ICD-10-CM

## 2019-03-02 MED ORDER — NA SULFATE-K SULFATE-MG SULF 17.5-3.13-1.6 GM/177ML PO SOLN: 1.0000 | Freq: Once | ORAL | 0 refills | Status: AC

## 2019-03-02 NOTE — Progress Notes (Signed)
Denies allergies to eggs or soy products. Denies complication of anesthesia or sedation. Denies use of weight loss medication. Denies use of O2.   Emmi instructions declined.  Pre-Visit was conducted by phone. Insurance was verified. All forms were mailed to [address] [place], [place] [address].   A 15.00 coupon was mailed to the patient.

## 2019-03-04 ENCOUNTER — Telehealth: Payer: Self-pay | Admitting: Gastroenterology

## 2019-03-04 NOTE — Telephone Encounter (Signed)
Pt states prep 119.00--  Called in a suprep pay no more than $50 coupon to [employer] [address] [place] [id number] pcn [id number] Group [id number] Id [id number]  Left on voice mail- pt aware

## 2019-03-04 NOTE — Telephone Encounter (Signed)
Pt states that suprep is too expensive and is wondering if she can have the generic form that is more affordable.

## 2019-03-16 ENCOUNTER — Encounter: Payer: BLUE CROSS/BLUE SHIELD | Admitting: Gastroenterology

## 2019-03-19 ENCOUNTER — Telehealth: Payer: Self-pay | Admitting: *Deleted

## 2019-03-19 NOTE — Telephone Encounter (Signed)
Spoke with patient regarding canceling her procedure on May 1 with Dr. Havery Moros due to COVID 19. Will reschedule patient MID May. Pt verbalized understanding. SM

## 2019-03-31 ENCOUNTER — Encounter: Payer: BLUE CROSS/BLUE SHIELD | Admitting: Gastroenterology

## 2019-04-02 ENCOUNTER — Encounter: Payer: BLUE CROSS/BLUE SHIELD | Admitting: Gastroenterology

## 2019-04-03 ENCOUNTER — Encounter: Payer: BLUE CROSS/BLUE SHIELD | Admitting: Gastroenterology

## 2019-04-14 ENCOUNTER — Encounter: Payer: Self-pay | Admitting: *Deleted

## 2019-05-15 ENCOUNTER — Telehealth: Payer: Self-pay

## 2019-05-15 NOTE — Telephone Encounter (Signed)
Covid-19 screening questions  Have you traveled in the last 14 days? No. If yes where?  Do you now or have you had a fever in the last 14 days? No.  Do you have any respiratory symptoms of shortness of breath or cough now or in the last 14 days? No.  Do you have any family members or close contacts with diagnosed or suspected Covid-19 in the past 14 days? No.  Have you been tested for Covid-19 and found to be positive? No.       

## 2019-05-18 ENCOUNTER — Encounter: Payer: Self-pay | Admitting: Gastroenterology

## 2019-05-18 ENCOUNTER — Ambulatory Visit (AMBULATORY_SURGERY_CENTER): Payer: BC Managed Care – PPO | Admitting: Gastroenterology

## 2019-05-18 ENCOUNTER — Other Ambulatory Visit: Payer: Self-pay

## 2019-05-18 ENCOUNTER — Encounter: Payer: BLUE CROSS/BLUE SHIELD | Admitting: Gastroenterology

## 2019-05-18 VITALS — BP 132/70 | HR 65 | Temp 97.9°F | Resp 11 | Ht 63.0 in | Wt 155.0 lb

## 2019-05-18 DIAGNOSIS — D120 Benign neoplasm of cecum: Secondary | ICD-10-CM

## 2019-05-18 DIAGNOSIS — Z1211 Encounter for screening for malignant neoplasm of colon: Secondary | ICD-10-CM | POA: Diagnosis present

## 2019-05-18 DIAGNOSIS — D123 Benign neoplasm of transverse colon: Secondary | ICD-10-CM

## 2019-05-18 DIAGNOSIS — D122 Benign neoplasm of ascending colon: Secondary | ICD-10-CM

## 2019-05-18 HISTORY — PX: COLONOSCOPY: SHX174

## 2019-05-18 MED ORDER — SODIUM CHLORIDE 0.9 % IV SOLN: 500.0000 mL | Freq: Once | INTRAVENOUS | Status: DC

## 2019-05-18 NOTE — Progress Notes (Signed)
Called to room to assist during endoscopic procedure.  Patient ID and intended procedure confirmed with present staff. Received instructions for my participation in the procedure from the performing physician.  

## 2019-05-18 NOTE — Patient Instructions (Signed)
YOU HAD AN ENDOSCOPIC PROCEDURE TODAY AT THE [facility]:   Refer to the procedure report that was given to you for any specific questions about what was found during the examination.  If the procedure report does not answer your questions, please call your gastroenterologist to clarify.  If you requested that your care partner not be given the details of your procedure findings, then the procedure report has been included in a sealed envelope for you to review at your convenience later.  YOU SHOULD EXPECT: Some feelings of bloating in the abdomen. Passage of more gas than usual.  Walking can help get rid of the air that was put into your GI tract during the procedure and reduce the bloating. If you had a lower endoscopy (such as a colonoscopy or flexible sigmoidoscopy) you may notice spotting of blood in your stool or on the toilet paper. If you underwent a bowel prep for your procedure, you may not have a normal bowel movement for a few days.  Please Note:  You might notice some irritation and congestion in your nose or some drainage.  This is from the oxygen used during your procedure.  There is no need for concern and it should clear up in a day or so.  SYMPTOMS TO REPORT IMMEDIATELY:   Following lower endoscopy (colonoscopy or flexible sigmoidoscopy):  Excessive amounts of blood in the stool  Significant tenderness or worsening of abdominal pains  Swelling of the abdomen that is new, acute  Fever of 100F or higher  For urgent or emergent issues, a gastroenterologist can be reached at any hour by calling [phone].   DIET:  We do recommend a small meal at first, but then you may proceed to your regular diet.  Drink plenty of fluids but you should avoid alcoholic beverages for 24 hours.  ACTIVITY:  You should plan to take it easy for the rest of today and you should NOT DRIVE or use heavy machinery until tomorrow (because of the sedation medicines used during the test).     FOLLOW UP: Our staff will call the number listed on your records 48-72 hours following your procedure to check on you and address any questions or concerns that you may have regarding the information given to you following your procedure. If we do not reach you, we will leave a message.  We will attempt to reach you two times.  During this call, we will ask if you have developed any symptoms of COVID 19. If you develop any symptoms (ie: fever, flu-like symptoms, shortness of breath, cough etc.) before then, please call [phone].  If you test positive for Covid 19 in the 2 weeks post procedure, please call and report this information to us.    If any biopsies were taken you will be contacted by phone or by letter within the next 1-3 weeks.  Please call us at [phone] if you have not heard about the biopsies in 3 weeks.    SIGNATURES/CONFIDENTIALITY: You and/or your care partner have signed paperwork which will be entered into your electronic medical record.  These signatures attest to the fact that that the information above on your After Visit Summary has been reviewed and is understood.  Full responsibility of the confidentiality of this discharge information lies with you and/or your care-partner. 

## 2019-05-18 NOTE — Op Note (Signed)
[facility] Patient Name: Sheryl Schmidt Procedure Date: 05/18/2019 10:01 AM MRN: [id number] Endoscopist: Remo Lipps P. Havery Moros , MD Age: 68 Referring MD:  Date of Birth: [birth year] Gender: Female Account #: [id number] Procedure:                Colonoscopy Indications:              Screening for colorectal malignant neoplasm Medicines:                Monitored Anesthesia Care Procedure:                Pre-Anesthesia Assessment:                           - Prior to the procedure, a History and Physical                            was performed, and patient medications and                            allergies were reviewed. The patient's tolerance of                            previous anesthesia was also reviewed. The risks                            and benefits of the procedure and the sedation                            options and risks were discussed with the patient.                            All questions were answered, and informed consent                            was obtained. Prior Anticoagulants: The patient has                            taken no previous anticoagulant or antiplatelet                            agents. ASA Grade Assessment: II - A patient with                            mild systemic disease. After reviewing the risks                            and benefits, the patient was deemed in                            satisfactory condition to undergo the procedure.                           After obtaining informed consent, the colonoscope  was passed under direct vision. Throughout the                            procedure, the patient's blood pressure, pulse, and                            oxygen saturations were monitored continuously. The                            Model CF-HQ190L [id number]) scope was introduced                            through the anus and advanced to the the cecum,   identified by appendiceal orifice and ileocecal                            valve. The colonoscopy was performed without                            difficulty. The patient tolerated the procedure                            well. The quality of the bowel preparation was                            good. The ileocecal valve, appendiceal orifice, and                            rectum were photographed. Scope In: 10:07:11 AM Scope Out: 10:30:19 AM Scope Withdrawal Time: 0 hours 19 minutes 0 seconds  Total Procedure Duration: 0 hours 23 minutes 8 seconds  Findings:                 The perianal and digital rectal examinations were                            normal.                           A 4 mm polyp was found in the cecum. The polyp was                            sessile. The polyp was removed with a cold snare.                            Resection and retrieval were complete.                           A diminutive polyp was found in the ascending                            colon. The polyp was sessile. The polyp was removed                            with  a cold snare. Resection and retrieval were                            complete.                           A 3 mm polyp was found in the transverse colon. The                            polyp was sessile. The polyp was removed with a                            cold snare. Resection and retrieval were complete.                           A diminutive polyp was found in the transverse                            colon. The polyp was sessile. The polyp was removed                            with a cold biopsy forceps (multiple attempts via                            snare resection were not possible, could not grasp                            tissue). Resection and retrieval were complete.                           The colon was tortuous.                           The exam was otherwise without abnormality. Complications:            No immediate  complications. Estimated blood loss:                            Minimal. Estimated Blood Loss:     Estimated blood loss was minimal. Impression:               - One 4 mm polyp in the cecum, removed with a cold                            snare. Resected and retrieved.                           - One diminutive polyp in the ascending colon,                            removed with a cold snare. Resected and retrieved.                           - One 3 mm polyp in the transverse colon, removed  with a cold snare. Resected and retrieved.                           - One diminutive polyp in the transverse colon,                            removed with a cold biopsy forceps. Resected and                            retrieved.                           - Tortuous colon.                           - The examination was otherwise normal. Recommendation:           - Patient has a contact number available for                            emergencies. The signs and symptoms of potential                            delayed complications were discussed with the                            patient. Return to normal activities tomorrow.                            Written discharge instructions were provided to the                            patient.                           - Resume previous diet.                           - Continue present medications.                           - Await pathology results. Remo Lipps P. Armbruster, MD 05/18/2019 10:35:30 AM This report has been signed electronically.

## 2019-05-18 NOTE — Progress Notes (Signed)
Report given to PACU, vss 

## 2019-05-20 ENCOUNTER — Telehealth: Payer: Self-pay | Admitting: *Deleted

## 2019-05-20 NOTE — Telephone Encounter (Signed)
1. Have you developed a fever since your procedure? no  2.   Have you had an respiratory symptoms (SOB or cough) since your procedure? No   3.   Have you tested positive for COVID 19 since your procedure no  4.   Have you had any family members/close contacts diagnosed with the COVID 19 since your procedure?  no   If yes to any of these questions please route to Joylene John, RN and Alphonsa Gin, Therapist, sports.  Follow up Call-  Call back number 05/18/2019  Post procedure Call Back phone  # [phone]  Permission to leave phone message Yes  Some recent data might be hidden     Patient questions:  Do you have a fever, pain , or abdominal swelling? No. Pain Score  0 *  Have you tolerated food without any problems? Yes.    Have you been able to return to your normal activities? Yes.    Do you have any questions about your discharge instructions: Diet   No. Medications  No. Follow up visit  No.  Do you have questions or concerns about your Care? No.  Actions: * If pain score is 4 or above: No action needed, pain <4.

## 2019-07-11 ENCOUNTER — Encounter (HOSPITAL_BASED_OUTPATIENT_CLINIC_OR_DEPARTMENT_OTHER): Payer: Self-pay | Admitting: Adult Health

## 2019-07-11 ENCOUNTER — Other Ambulatory Visit: Payer: Self-pay

## 2019-07-11 ENCOUNTER — Emergency Department (HOSPITAL_BASED_OUTPATIENT_CLINIC_OR_DEPARTMENT_OTHER)
Admission: EM | Admit: 2019-07-11 | Discharge: 2019-07-12 | Disposition: A | Discharge status: Home / Self Care | Payer: Medicare Other | Attending: Emergency Medicine | Admitting: Emergency Medicine

## 2019-07-11 DIAGNOSIS — T50915A Adverse effect of multiple unspecified drugs, medicaments and biological substances, initial encounter: Secondary | ICD-10-CM | POA: Insufficient documentation

## 2019-07-11 DIAGNOSIS — T50905A Adverse effect of unspecified drugs, medicaments and biological substances, initial encounter: Secondary | ICD-10-CM

## 2019-07-11 DIAGNOSIS — T7840XA Allergy, unspecified, initial encounter: Secondary | ICD-10-CM | POA: Diagnosis present

## 2019-07-11 DIAGNOSIS — I10 Essential (primary) hypertension: Secondary | ICD-10-CM | POA: Insufficient documentation

## 2019-07-11 MED ORDER — SODIUM CHLORIDE 0.9 % IV SOLN
INTRAVENOUS | Status: DC | PRN
  Administered 2019-07-11: 23:00:00 via INTRAVENOUS

## 2019-07-11 MED ORDER — FAMOTIDINE IN NACL 20-0.9 MG/50ML-% IV SOLN
20.0000 mg | Freq: Once | INTRAVENOUS | Status: AC
  Administered 2019-07-11: 20 mg via INTRAVENOUS
  Filled 2019-07-11: qty 50

## 2019-07-11 MED ORDER — METHYLPREDNISOLONE SODIUM SUCC 125 MG IJ SOLR
125.0000 mg | Freq: Once | INTRAMUSCULAR | Status: AC
  Administered 2019-07-11: 125 mg via INTRAVENOUS
  Filled 2019-07-11: qty 2

## 2019-07-11 NOTE — ED Notes (Signed)
ED Provider at bedside. 

## 2019-07-11 NOTE — ED Provider Notes (Signed)
Emergency Department Provider Note   I have reviewed the triage vital signs and the nursing notes.   HISTORY  Chief Complaint Allergic Reaction   HPI Sheryl Schmidt is a 68 y.o. female with history of hypertension on losartan who presents the emergency department today secondary to eyelid and upper lip swelling.  No new exposures.  Started this morning and seemed to progressively worsen throughout the day.  She also has a mild scratchy throat but no other associated symptoms.  She has been cleaning and painting recently but this is not abnormal.  She has no other new detergents or new foods.  No history of allergies.  No new medications.  No nausea, vomiting, syncope, rash or other associated symptoms.   No other associated or modifying symptoms.    Past Medical History:  Diagnosis Date  . Allergy   . Cholelithiasis   . Hypertension   . Internal hemorrhoid     Patient Active Problem List   Diagnosis Date Noted  . 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 Symptomatic cholelithiasis Status post laparoscopic cholecystectomy 02/18/2014 02/10/2014  . HYPERTENSION 01/26/2008  . GERD 01/26/2008  . 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 ALLERGY 01/26/2008    Past Surgical History:  Procedure Laterality Date  . BACK SURGERY  2011   removed L1 and removed a rib  . CESAREAN SECTION    . CHOLECYSTECTOMY N/A 02/18/2014   Procedure: LAPAROSCOPIC CHOLECYSTECTOMY WITH INTRAOPERATIVE CHOLANGIOGRAM;  Surgeon: Odis Hollingshead, MD;  Location: [facility];  Service: General;  Laterality: N/A;    Current Outpatient Rx  . Order #: [id number] Class: Historical Med  . Order #: [id number] Class: Historical Med  . Order #: [id number] Class: Historical Med  . Order #: [id number] Class: Historical Med  . Order #: [id number] Class: Historical Med  . Order #: [id number] Class: Historical Med  . Order #: [id number] Class: Historical Med  . Order #: [id number] Class: Historical Med  . Order #: [id number] Class:  Historical Med  . Order #: [id number] Class: Print  . Order #: [id number] Class: Historical Med    Allergies Sulfonamide derivatives  Family History  Problem Relation Age of Onset  . COPD Father   . Heart disease Father   . Colon cancer Maternal Uncle   . Diabetes Other   . Esophageal cancer Neg Hx   . Rectal cancer Neg Hx   . Stomach cancer Neg Hx     Social History Social History   Tobacco Use  . Smoking status: Never Smoker  . Smokeless tobacco: Never Used  Substance Use Topics  . Alcohol use: Yes    Comment: rarely  . Drug use: No    Review of Systems  All other systems negative except as documented in the HPI. All pertinent positives and negatives as reviewed in the HPI. ____________________________________________   PHYSICAL EXAM:  VITAL SIGNS: ED Triage Vitals  Enc Vitals Group     BP 07/11/19 2242 (!) 146/84     Pulse Rate 07/11/19 2242 72     Resp 07/11/19 2242 16     Temp 07/11/19 2247 97.6 F (36.4 C)     Temp Source 07/11/19 2247 Oral     SpO2 07/11/19 2242 100 %    Constitutional: Alert and oriented. Well appearing and in no acute distress. Eyes: Conjunctivae are normal. PERRL. EOMI. Periorbital edema. Head: Atraumatic. Nose: No congestion/rhinnorhea. Mouth/Throat: Mucous membranes are moist.  Oropharynx non-erythematous. Upper lip is edematous.  Neck: No stridor.  No meningeal signs.   Cardiovascular: Normal rate,  regular rhythm. Good peripheral circulation. Grossly normal heart sounds.   Respiratory: Normal respiratory effort.  No retractions. Lungs CTAB. Gastrointestinal: Soft and nontender. No distention.  Musculoskeletal: No lower extremity tenderness nor edema. No gross deformities of extremities. Neurologic:  Normal speech and language. No gross focal neurologic deficits are appreciated.  Skin:  Skin is warm, dry and intact. No rash noted.  ___________________________________________   INITIAL IMPRESSION / ASSESSMENT AND PLAN / ED  COURSE  Appears to be angioedema. Likely related to ARB, advise to stop it. Will provide supportive care here and a period of observation to ensure no worsening. No indication for FFP/intubation at this time.   Multiple reevaluations and patient overall appeared to be improving. No further signs of worsening or anaphylaxis. Will dc ARB and fu w/ pcp for further med changes.     Pertinent labs & imaging results that were available during my care of the patient were reviewed by me and considered in my medical decision making (see chart for details).   A medical screening exam was performed and I feel the patient has had an appropriate workup for their chief complaint at this time and likelihood of emergent condition existing is low. They have been counseled on decision, discharge, follow up and which symptoms necessitate immediate return to the emergency department. They or their family verbally stated understanding and agreement with plan and discharged in stable condition.   ____________________________________________  FINAL CLINICAL IMPRESSION(S) / ED DIAGNOSES  Final diagnoses:  Adverse effect of drug, initial encounter     MEDICATIONS GIVEN DURING THIS VISIT:  Medications  0.9 %  sodium chloride infusion ( Intravenous Stopped 07/12/19 0150)  diphenhydrAMINE (BENADRYL) 25 mg in sodium chloride 0.9 % 50 mL IVPB (0 mg Intravenous Stopped 07/12/19 0056)  diphenhydrAMINE (BENADRYL) 50 MG/ML injection (has no administration in time range)  methylPREDNISolone sodium succinate (SOLU-MEDROL) 125 mg/2 mL injection 125 mg (125 mg Intravenous Given 07/11/19 2316)  famotidine (PEPCID) IVPB 20 mg premix (0 mg Intravenous Stopped 07/11/19 2354)     NEW OUTPATIENT MEDICATIONS STARTED DURING THIS VISIT:  Discharge Medication List as of 07/12/2019  1:36 AM    START taking these medications   Details  predniSONE (DELTASONE) 20 MG tablet 2 tabs po daily x 4 days, Print        Note:  This note was  prepared with assistance of Dragon voice recognition software. Occasional wrong-word or sound-a-like substitutions may have occurred due to the inherent limitations of voice recognition software.   Makailey Hodgkin, Corene Cornea, MD 07/12/19 [phone]

## 2019-07-11 NOTE — ED Triage Notes (Signed)
PResents with one day of facial swelling, itching and throat tightness that began last night. She took benadryl at 10 pm.

## 2019-07-11 NOTE — ED Notes (Signed)
EDP at bedside  

## 2019-07-11 NOTE — ED Notes (Signed)
Pt has swelling to bilat eyes, lips, and redness noted. Pt not in any distress. Pt takes losartan- pt denies any new detergents, foods, or any changes in daily activity. Pt was painting her house today.

## 2019-07-11 NOTE — ED Provider Notes (Signed)
MSE was initiated and I personally evaluated the patient and placed orders (if any) at  11:00 PM on July 11, 2019.  The patient appears stable so that the remainder of the MSE may be completed by another provider.  68 year old female complaining of swelling around her lips and her eyes and some redness over her face.  She said this started since this morning.  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  It sounds like it was a latex paint.  Family is concerned because they think there is some asbestos possibly in the house where they were painting.  No other unusual contacts and no different make-ups or soaps or shampoos.  No change in medications.  Patient also says there is a little bit of throat postnasal drip feeling has been going on since last night.  She took 2 Benadryl at 10 PM.  Is in no distress satting 100% with normal voice.   Hayden Rasmussen, MD 07/11/19 [phone]

## 2019-07-12 MED ORDER — PREDNISONE 20 MG PO TABS: ORAL_TABLET | ORAL | 0 refills | Status: DC

## 2019-07-12 MED ORDER — DIPHENHYDRAMINE HCL 50 MG/ML IJ SOLN
INTRAMUSCULAR | Status: AC
  Filled 2019-07-12: qty 1

## 2019-07-12 MED ORDER — SODIUM CHLORIDE 0.9 % IV SOLN
25.0000 mg | INTRAVENOUS | Status: DC | PRN
  Administered 2019-07-12: 25 mg via INTRAVENOUS
  Filled 2019-07-12: qty 0.5

## 2019-07-12 NOTE — ED Notes (Signed)
Pt understood dc material. NAD noted. Script given at [employer]. All questions answered to satisfaction. Pt and family member escorted to check out counter.

## 2019-07-12 NOTE — Discharge Instructions (Addendum)
I think that your swelling came from a adverse reaction to the medication you are on.  Please discontinue his medicine to follow with your doctor to get it rechecked.  If you have any worsening shortness of breath, swelling in your throat, swelling of your tongue or underneath your tongue, swelling of lips please return to the nearest emergency department immediately.

## 2019-08-12 DIAGNOSIS — I10 Essential (primary) hypertension: Secondary | ICD-10-CM | POA: Diagnosis not present

## 2019-08-18 DIAGNOSIS — H6123 Impacted cerumen, bilateral: Secondary | ICD-10-CM | POA: Diagnosis not present

## 2019-08-18 DIAGNOSIS — J3489 Other specified disorders of nose and nasal sinuses: Secondary | ICD-10-CM | POA: Diagnosis not present

## 2019-11-19 DIAGNOSIS — Z2082 Contact with and (suspected) exposure to other viral communicable diseases: Secondary | ICD-10-CM | POA: Diagnosis not present

## 2019-11-19 DIAGNOSIS — Z20828 Contact with and (suspected) exposure to other viral communicable diseases: Secondary | ICD-10-CM | POA: Diagnosis not present

## 2019-11-30 ENCOUNTER — Other Ambulatory Visit: Payer: Self-pay | Admitting: Obstetrics and Gynecology

## 2019-11-30 DIAGNOSIS — Z1231 Encounter for screening mammogram for malignant neoplasm of breast: Secondary | ICD-10-CM

## 2019-12-25 DIAGNOSIS — L821 Other seborrheic keratosis: Secondary | ICD-10-CM | POA: Diagnosis not present

## 2019-12-25 DIAGNOSIS — R208 Other disturbances of skin sensation: Secondary | ICD-10-CM | POA: Diagnosis not present

## 2019-12-25 DIAGNOSIS — L918 Other hypertrophic disorders of the skin: Secondary | ICD-10-CM | POA: Diagnosis not present

## 2019-12-25 DIAGNOSIS — L814 Other melanin hyperpigmentation: Secondary | ICD-10-CM | POA: Diagnosis not present

## 2019-12-25 DIAGNOSIS — D225 Melanocytic nevi of trunk: Secondary | ICD-10-CM | POA: Diagnosis not present

## 2019-12-25 DIAGNOSIS — D2271 Melanocytic nevi of right lower limb, including hip: Secondary | ICD-10-CM | POA: Diagnosis not present

## 2019-12-25 DIAGNOSIS — Z85828 Personal history of other malignant neoplasm of skin: Secondary | ICD-10-CM | POA: Diagnosis not present

## 2020-01-05 DIAGNOSIS — M542 Cervicalgia: Secondary | ICD-10-CM | POA: Diagnosis not present

## 2020-01-05 DIAGNOSIS — M47812 Spondylosis without myelopathy or radiculopathy, cervical region: Secondary | ICD-10-CM | POA: Diagnosis not present

## 2020-01-08 ENCOUNTER — Ambulatory Visit
Admission: RE | Admit: 2020-01-08 | Discharge: 2020-01-08 | Disposition: A | Discharge status: Home / Self Care | Payer: PPO | Source: Ambulatory Visit | Attending: Obstetrics and Gynecology | Admitting: Obstetrics and Gynecology

## 2020-01-08 ENCOUNTER — Other Ambulatory Visit: Payer: Self-pay

## 2020-01-08 DIAGNOSIS — Z1231 Encounter for screening mammogram for malignant neoplasm of breast: Secondary | ICD-10-CM | POA: Diagnosis not present

## 2020-02-01 DIAGNOSIS — M542 Cervicalgia: Secondary | ICD-10-CM | POA: Diagnosis not present

## 2020-02-06 DIAGNOSIS — Z20822 Contact with and (suspected) exposure to covid-19: Secondary | ICD-10-CM | POA: Diagnosis not present

## 2020-03-28 DIAGNOSIS — H2513 Age-related nuclear cataract, bilateral: Secondary | ICD-10-CM | POA: Diagnosis not present

## 2020-03-28 DIAGNOSIS — H40013 Open angle with borderline findings, low risk, bilateral: Secondary | ICD-10-CM | POA: Diagnosis not present

## 2020-05-30 DIAGNOSIS — I10 Essential (primary) hypertension: Secondary | ICD-10-CM | POA: Diagnosis not present

## 2020-05-30 DIAGNOSIS — Z0000 Encounter for general adult medical examination without abnormal findings: Secondary | ICD-10-CM | POA: Diagnosis not present

## 2020-05-30 DIAGNOSIS — Z136 Encounter for screening for cardiovascular disorders: Secondary | ICD-10-CM | POA: Diagnosis not present

## 2020-05-30 DIAGNOSIS — Z1389 Encounter for screening for other disorder: Secondary | ICD-10-CM | POA: Diagnosis not present

## 2020-05-30 DIAGNOSIS — Z23 Encounter for immunization: Secondary | ICD-10-CM | POA: Diagnosis not present

## 2020-08-13 ENCOUNTER — Other Ambulatory Visit: Payer: Self-pay

## 2020-08-13 ENCOUNTER — Emergency Department (HOSPITAL_BASED_OUTPATIENT_CLINIC_OR_DEPARTMENT_OTHER)
Admission: EM | Admit: 2020-08-13 | Discharge: 2020-08-13 | Disposition: A | Discharge status: Home / Self Care | Payer: PPO | Attending: Emergency Medicine | Admitting: Emergency Medicine

## 2020-08-13 ENCOUNTER — Encounter (HOSPITAL_BASED_OUTPATIENT_CLINIC_OR_DEPARTMENT_OTHER): Payer: Self-pay

## 2020-08-13 DIAGNOSIS — I10 Essential (primary) hypertension: Secondary | ICD-10-CM | POA: Insufficient documentation

## 2020-08-13 DIAGNOSIS — Z79899 Other long term (current) drug therapy: Secondary | ICD-10-CM | POA: Diagnosis not present

## 2020-08-13 DIAGNOSIS — R220 Localized swelling, mass and lump, head: Secondary | ICD-10-CM | POA: Insufficient documentation

## 2020-08-13 LAB — CBC WITH DIFFERENTIAL/PLATELET
Abs Immature Granulocytes: 0.03 10*3/uL (ref 0.00–0.07)
Basophils Absolute: 0 10*3/uL (ref 0.0–0.1)
Basophils Relative: 1 %
Eosinophils Absolute: 0.3 10*3/uL (ref 0.0–0.5)
Eosinophils Relative: 4 %
HCT: 41.9 % (ref 36.0–46.0)
Hemoglobin: 13.8 g/dL (ref 12.0–15.0)
Immature Granulocytes: 0 %
Lymphocytes Relative: 30 %
Lymphs Abs: 2.2 10*3/uL (ref 0.7–4.0)
MCH: 28.9 pg (ref 26.0–34.0)
MCHC: 32.9 g/dL (ref 30.0–36.0)
MCV: 87.7 fL (ref 80.0–100.0)
Monocytes Absolute: 0.5 10*3/uL (ref 0.1–1.0)
Monocytes Relative: 6 %
Neutro Abs: 4.5 10*3/uL (ref 1.7–7.7)
Neutrophils Relative %: 59 %
Platelets: 272 10*3/uL (ref 150–400)
RBC: 4.78 MIL/uL (ref 3.87–5.11)
RDW: 13.1 % (ref 11.5–15.5)
WBC: 7.6 10*3/uL (ref 4.0–10.5)
nRBC: 0 % (ref 0.0–0.2)

## 2020-08-13 LAB — COMPREHENSIVE METABOLIC PANEL
ALT: 18 U/L (ref 0–44)
AST: 19 U/L (ref 15–41)
Albumin: 3.8 g/dL (ref 3.5–5.0)
Alkaline Phosphatase: 87 U/L (ref 38–126)
Anion gap: 9 (ref 5–15)
BUN: 12 mg/dL (ref 8–23)
CO2: 24 mmol/L (ref 22–32)
Calcium: 8.5 mg/dL — ABNORMAL LOW (ref 8.9–10.3)
Chloride: 105 mmol/L (ref 98–111)
Creatinine, Ser: 0.7 mg/dL (ref 0.44–1.00)
GFR calc Af Amer: >60 mL/min (ref >60)
GFR calc non Af Amer: >60 mL/min (ref >60)
Glucose, Bld: 86 mg/dL (ref 70–99)
Potassium: 4 mmol/L (ref 3.5–5.1)
Sodium: 138 mmol/L (ref 135–145)
Total Bilirubin: 0.6 mg/dL (ref 0.3–1.2)
Total Protein: 7 g/dL (ref 6.5–8.1)

## 2020-08-13 MED ORDER — METHYLPREDNISOLONE SODIUM SUCC 125 MG IJ SOLR
125.0000 mg | Freq: Once | INTRAMUSCULAR | Status: AC
  Administered 2020-08-13: 125 mg via INTRAVENOUS
  Filled 2020-08-13: qty 2

## 2020-08-13 MED ORDER — FAMOTIDINE IN NACL 20-0.9 MG/50ML-% IV SOLN
20.0000 mg | Freq: Once | INTRAVENOUS | Status: AC
  Administered 2020-08-13: 20 mg via INTRAVENOUS
  Filled 2020-08-13: qty 50

## 2020-08-13 MED ORDER — SODIUM CHLORIDE 0.9 % IV BOLUS
1000.0000 mL | Freq: Once | INTRAVENOUS | Status: AC
  Administered 2020-08-13: 1000 mL via INTRAVENOUS

## 2020-08-13 MED ORDER — PREDNISONE 20 MG PO TABS: ORAL_TABLET | ORAL | 0 refills | Status: DC

## 2020-08-13 NOTE — ED Provider Notes (Signed)
[facility] EMERGENCY DEPARTMENT Provider Note   CSN: [id number] Arrival date & time: 08/13/20  1324     History Chief Complaint  Patient presents with  . Allergic Reaction    Sheryl Schmidt is a 69 y.o. female  lip and eye swelling.  Patient states that she does not remember what happened but this morning she started having upper lip and eye swelling bilaterally.  Patient took some Benadryl and Pepcid and it helped.  Patient had similar allergic reaction about a year ago and came to the ER and received steroids and Benadryl and Pepcid.  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  She has not seen an allergist for this problem.  The history is provided by the patient.       Past Medical History:  Diagnosis Date  . Allergy   . Cholelithiasis   . Hypertension   . Internal hemorrhoid     Patient Active Problem List   Diagnosis Date Noted  . 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 Symptomatic cholelithiasis Status post laparoscopic cholecystectomy 02/18/2014 02/10/2014  . HYPERTENSION 01/26/2008  . GERD 01/26/2008  . 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 ALLERGY 01/26/2008    Past Surgical History:  Procedure Laterality Date  . BACK SURGERY  2011   removed L1 and removed a rib  . CESAREAN SECTION    . CHOLECYSTECTOMY N/A 02/18/2014   Procedure: LAPAROSCOPIC CHOLECYSTECTOMY WITH INTRAOPERATIVE CHOLANGIOGRAM;  Surgeon: Odis Hollingshead, MD;  Location: [facility];  Service: General;  Laterality: N/A;     OB History   No obstetric history on file.     Family History  Problem Relation Age of Onset  . COPD Father   . Heart disease Father   . Colon cancer Maternal Uncle   . Diabetes Other   . Esophageal cancer Neg Hx   . Rectal cancer Neg Hx   . Stomach cancer Neg Hx     Social History   Tobacco Use  . Smoking status: Never Smoker  . Smokeless tobacco: Never Used  Vaping Use   . Vaping Use: Never used  Substance Use Topics  . Alcohol use: Yes    Comment: rarely  . Drug use: No    Home Medications Prior to Admission medications   Medication Sig Start Date End Date Taking? Authorizing Provider  Ascorbic Acid (VITAMIN C) 1000 MG tablet Take 1,000 mg by mouth daily.    [provider]  aspirin 81 MG chewable tablet Chew 81 mg by mouth daily.    [provider]  cholecalciferol (VITAMIN D) 1000 UNITS tablet Take 1,000 Units by mouth daily.    [provider]  Coenzyme Q10 (COQ10) 200 MG CAPS Take 1 capsule by mouth daily.    [provider]  folic acid (FOLVITE) 341 MCG tablet Take 400 mcg by mouth daily.    [provider]  ibuprofen (ADVIL,MOTRIN) 200 MG tablet Take 400 mg by mouth every 6 (six) hours as needed for mild pain or moderate pain.    [provider]  MAGNESIUM PO Take 250 mg by mouth daily.    [provider]  Multiple Vitamin (MULTIVITAMIN) tablet Take 1 tablet by mouth daily.    [provider]  Omega-3 Krill Oil 1000 MG CAPS Take 1 capsule by mouth daily.     [provider]  predniSONE (DELTASONE) 20 MG tablet 2  tabs po daily x 4 days 07/12/19   Mesner, Corene Cornea, MD  TURMERIC CURCUMIN PO Take 900 mg by mouth 2 (two) times daily.    [provider]    Allergies    Sulfonamide derivatives  Review of Systems   Review of Systems  HENT:       Upper lip swelling   All other systems reviewed and are negative.   Physical Exam Updated Vital Signs BP (!) 164/81 (BP Location: Left Arm)   Pulse 69   Temp 98 F (36.7 C) (Oral)   Resp 20   Ht 5\' 3"  (1.6 m)   Wt 75.3 kg   SpO2 100%   BMI 29.41 kg/m   Physical Exam Vitals and nursing note reviewed.  HENT:     Head: Normocephalic.     Mouth/Throat:     Comments: Mild upper lip and lower eyelid swelling.  Posterior oropharynx is clear.  No tongue swelling. Eyes:     Extraocular Movements: Extraocular  movements intact.     Pupils: Pupils are equal, round, and reactive to light.     Comments: No obvious conjunctivitis  Cardiovascular:     Rate and Rhythm: Normal rate and regular rhythm.     Pulses: Normal pulses.     Heart sounds: Normal heart sounds.  Pulmonary:     Effort: Pulmonary effort is normal.     Breath sounds: Normal breath sounds.  Abdominal:     General: Abdomen is flat.     Palpations: Abdomen is soft.  Musculoskeletal:        General: Normal range of motion.     Cervical back: Normal range of motion and neck supple.  Skin:    General: Skin is warm.     Capillary Refill: Capillary refill takes less than 2 seconds.  Neurological:     General: No focal deficit present.     Mental Status: She is alert.  Psychiatric:        Mood and Affect: Mood normal.        Behavior: Behavior normal.     ED Results / Procedures / Treatments   Labs (all labs ordered are listed, but only abnormal results are displayed) Labs Reviewed  CBC WITH DIFFERENTIAL/PLATELET  COMPREHENSIVE METABOLIC PANEL    EKG None  Radiology No results found.  Procedures Procedures (including critical care time)  Medications Ordered in ED Medications  sodium chloride 0.9 % bolus 1,000 mL (has no administration in time range)  famotidine (PEPCID) IVPB 20 mg premix (has no administration in time range)  methylPREDNISolone sodium succinate (SOLU-MEDROL) 125 mg/2 mL injection 125 mg (has no administration in time range)    ED Course  I have reviewed the triage vital signs and the nursing notes.  Pertinent labs & imaging results that were available during my care of the patient were reviewed by me and considered in my medical decision making (see chart for details).    MDM Rules/Calculators/A&P                         Sheryl Schmidt is a 69 y.o. female here presenting with upper lip and lower eyelid swelling.  Patient has no new meds but is around latex paint a lot.  I wonder if she has  some mild allergy to latex pain.  No signs of angioedema or tongue swelling. Plan to get basic blood work and IV steroids and Pepcid.  Patient states that she drove  and wants to take Benadryl at home  5:31 PM Labs unremarkable.  Her facial swelling has improved.  Will discharge home with a course of steroids and Benadryl as needed.  She states that she will talk to her primary care doctor about allergy testing.   Final Clinical Impression(s) / ED Diagnoses Final diagnoses:  None    Rx / DC Orders ED Discharge Orders    None       Drenda Freeze, MD 08/13/20 1732

## 2020-08-13 NOTE — ED Triage Notes (Signed)
Pt c/o reddness and puffiness around both eyes and swelling of lips that has gradually been getting worse for a week. Pt states she took some benadryl this AM with relief. No swelling noted to tongue or posterior pharynx at this time. Lung sounds clear & =.

## 2020-08-13 NOTE — Discharge Instructions (Signed)
Take prednisone as prescribed  Take Benadryl 50 mg every 6 hours as needed for lip swelling and itchiness.  You should follow-up with an allergist to get allergy testing  Return to ER if you have worse rash or lip swelling or facial swelling or trouble breathing

## 2020-09-05 ENCOUNTER — Encounter: Payer: Self-pay | Admitting: Allergy and Immunology

## 2020-09-05 ENCOUNTER — Ambulatory Visit (INDEPENDENT_AMBULATORY_CARE_PROVIDER_SITE_OTHER): Payer: PPO | Admitting: Allergy and Immunology

## 2020-09-05 ENCOUNTER — Other Ambulatory Visit: Payer: Self-pay

## 2020-09-05 VITALS — BP 132/78 | HR 76 | Resp 14 | Ht 61.5 in | Wt 168.0 lb

## 2020-09-05 DIAGNOSIS — L989 Disorder of the skin and subcutaneous tissue, unspecified: Secondary | ICD-10-CM | POA: Diagnosis not present

## 2020-09-05 DIAGNOSIS — T7840XD Allergy, unspecified, subsequent encounter: Secondary | ICD-10-CM | POA: Diagnosis not present

## 2020-09-05 NOTE — Progress Notes (Signed)
[place] [facility] [place] [facility] [place] [facility] [place] [facility] [place]   Dear Dr. Laurann Montana,  Thank you for referring Sheryl Schmidt to the [facility] on 09/05/2020.   Below is a summation of this patient's evaluation and recommendations.  Thank you for your referral. I will keep you informed about this patient's response to treatment.   If you have any questions please do not hesitate to contact me.   Sincerely,  Jiles Prows, MD Allergy / Immunology [facility]   ______________________________________________________________________    NEW PATIENT NOTE  Referring Provider: Lavone Orn, MD Primary Provider: Lavone Orn, MD Date of office visit: 09/05/2020    Subjective:   Chief Complaint:  Sheryl Schmidt (DOB: [birth year]) is a 69 y.o. female who presents to the clinic on 09/05/2020 with a chief complaint of Allergic Reaction .     HPI: Sheryl Schmidt presents to this clinic in evaluation of recurrent allergic reactions.  Apparently sometime in early 2020 she started to develop a inflammatory dermatosis involving her face presenting as redness and dryness and scaliness for which she saw a dermatologist who treated her with a topical agent which worked pretty well.  She would have flareups about every 2 months.  In August 2020 she had a severe flareup with intense redness and swelling of her periorbital area and cheeks requiring her to go the emergency room and get treated with a systemic steroid.  At that point in time she had elimination of her losartan and a change to amlodipine.  Since that point in time she has not had any inflammatory dermatosis that has required a topical steroid.  However, in August 2021 she once again developed a significant inflammatory response of her facial skin once again involving her periorbital region and cheeks requiring her to go to the emergency room and once again  received prednisone.  At no point in time did she ever have any associated systemic or constitutional symptoms with these 2 allergic reactions, one occurring in August 2020 and one occurring in August 2021.  There was never an obvious provoking factor giving rise to this issue as she did not have any unusual environmental exposures or started any new supplements or health foods or have symptoms to suggest an ongoing infectious disease.  She will not be obtaining a Covid vaccine.  Past Medical History:  Diagnosis Date  . Allergy   . Cholelithiasis   . Hypertension   . Internal hemorrhoid     Past Surgical History:  Procedure Laterality Date  . BACK SURGERY  2011   removed L1 and removed a rib  . CESAREAN SECTION    . CHOLECYSTECTOMY N/A 02/18/2014   Procedure: LAPAROSCOPIC CHOLECYSTECTOMY WITH INTRAOPERATIVE CHOLANGIOGRAM;  Surgeon: Odis Hollingshead, MD;  Location: [facility];  Service: General;  Laterality: N/A;    Allergies as of 09/05/2020      Reactions   Sulfonamide Derivatives    REACTION: rash      Medication List    amLODipine 5 MG tablet Commonly known as: NORVASC Take 5 mg by mouth daily.   aspirin 81 MG chewable tablet Chew 81 mg by mouth daily.   CoQ10 200 MG Caps Take 1 capsule by mouth in the morning and at bedtime.   folic acid 341 MCG tablet Commonly known as: FOLVITE Take 800 mcg by mouth daily.   Garlic 9622 MG Caps Take 1 capsule by mouth daily.  Krill Oil 500 MG Caps Take 1 capsule by mouth in the morning and at bedtime.    MAGNESIUM PO Take 250 mg by mouth daily.   TURMERIC CURCUMIN PO Take 900 mg by mouth daily.   vitamin C 1000 MG tablet Take 1,000 mg by mouth in the morning and at bedtime.   VITAMIN D PO Take by mouth in the morning and at bedtime.       Review of systems negative except as noted in HPI / PMHx or noted below:  Review of Systems  Constitutional: Negative.   HENT: Negative.   Eyes: Negative.   Respiratory:  Negative.   Cardiovascular: Negative.   Gastrointestinal: Negative.   Genitourinary: Negative.   Musculoskeletal: Negative.   Skin: Negative.   Neurological: Negative.   Endo/Heme/Allergies: Negative.   Psychiatric/Behavioral: Negative.     Family History  Problem Relation Age of Onset  . COPD Father   . Heart disease Father   . Colon cancer Maternal Uncle   . Diabetes Other   . Asthma Daughter   . Esophageal cancer Neg Hx   . Rectal cancer Neg Hx   . Stomach cancer Neg Hx     Social History   Socioeconomic History  . Marital status: Married    Spouse name: Not on file  . Number of children: 2  . Years of education: Hotel manager school  . Highest education level: Not on file  Occupational History  . Occupation: Pharmacist, hospital  Tobacco Use  . Smoking status: Never Smoker  . Smokeless tobacco: Never Used  Vaping Use  . Vaping Use: Never used  Substance and Sexual Activity  . Alcohol use: Not Currently    Comment: rarely  . Drug use: No  . Sexual activity: Not on file  Other Topics Concern  . Not on file  Social History Narrative   Lives at home w/ husband   Right-handed   Caffeine: limited    Environmental and Social history  Lives in a house with a dry environment, no animals located inside the household, carpet in the bedroom, no plastic on the bed, no plastic on the pillow, no smoking ongoing with inside the household.  She works in an office setting.  Objective:   Vitals:   09/05/20 0940  BP: 132/78  Pulse: 76  Resp: 14  SpO2: 98%   Height: 5' 1.5" (156.2 cm) Weight: 168 lb (76.2 kg)  Physical Exam Constitutional:      Appearance: She is not diaphoretic.  HENT:     Head: Normocephalic.     Right Ear: Tympanic membrane, ear canal and external ear normal.     Left Ear: Tympanic membrane, ear canal and external ear normal.     Nose: Nose normal. No mucosal edema or rhinorrhea.     Mouth/Throat:     Pharynx: Uvula midline. No oropharyngeal  exudate.  Eyes:     Conjunctiva/sclera: Conjunctivae normal.  Neck:     Thyroid: No thyromegaly.     Trachea: Trachea normal. No tracheal tenderness or tracheal deviation.  Cardiovascular:     Rate and Rhythm: Normal rate and regular rhythm.     Heart sounds: Normal heart sounds, S1 normal and S2 normal. No murmur heard.   Pulmonary:     Effort: No respiratory distress.     Breath sounds: Normal breath sounds. No stridor. No wheezing or rales.  Lymphadenopathy:     Head:     Right side of head: No tonsillar  adenopathy.     Left side of head: No tonsillar adenopathy.     Cervical: No cervical adenopathy.  Skin:    Findings: No erythema or rash.     Nails: There is no clubbing.  Neurological:     Mental Status: She is alert.     Diagnostics: Allergy skin tests were performed.  She did not demonstrate any hypersensitivity against a screening panel of aeroallergens or foods.  Assessment and Plan:    1. Allergic reaction, subsequent encounter   2. Inflammatory dermatosis     Aracelia has some form of inflammatory dermatosis with acute onset and relatively quick resolution after receiving a systemic steroid with 2 distinct events occurring over the course of the past year or so.  There does not appear to be a classic atopic etiology responsible for this issue.  We will now have her perform patch testing to see if she is developing a contact dermatitis to various exposures.  We will get that arranged within the next week.  Jiles Prows, MD Allergy / Immunology [facility]

## 2020-09-06 ENCOUNTER — Encounter: Payer: Self-pay | Admitting: Allergy and Immunology

## 2020-09-26 ENCOUNTER — Other Ambulatory Visit: Payer: Self-pay

## 2020-09-26 ENCOUNTER — Ambulatory Visit (INDEPENDENT_AMBULATORY_CARE_PROVIDER_SITE_OTHER): Payer: PPO | Admitting: Allergy and Immunology

## 2020-09-26 DIAGNOSIS — L2389 Allergic contact dermatitis due to other agents: Secondary | ICD-10-CM | POA: Diagnosis not present

## 2020-09-26 DIAGNOSIS — L989 Disorder of the skin and subcutaneous tissue, unspecified: Secondary | ICD-10-CM

## 2020-09-27 ENCOUNTER — Encounter: Payer: Self-pay | Admitting: Allergy and Immunology

## 2020-09-27 NOTE — Progress Notes (Signed)
Sheryl Schmidt presents to this clinic to have a true test patch test placed.  She will return to this clinic in 48 hours for initial read.

## 2020-09-28 ENCOUNTER — Other Ambulatory Visit: Payer: Self-pay

## 2020-09-28 ENCOUNTER — Ambulatory Visit: Payer: PPO | Admitting: Allergy

## 2020-09-28 ENCOUNTER — Encounter: Payer: Self-pay | Admitting: Allergy

## 2020-09-28 ENCOUNTER — Encounter: Payer: PPO | Admitting: Allergy and Immunology

## 2020-09-28 DIAGNOSIS — L2389 Allergic contact dermatitis due to other agents: Secondary | ICD-10-CM

## 2020-09-28 NOTE — Progress Notes (Signed)
    Follow-up Note  RE: Sheryl Schmidt MRN: [id number] DOB: 02/18/1951 Date of Office Visit: 09/28/2020  Primary care provider: Lavone Orn, MD Referring provider: Lavone Orn, MD   Season returns to the office today for the initial patch test interpretation, given suspected history of contact dermatitis.    Diagnostics:  TRUE TEST 48 hour reading:  2+ Cl+ Me-Isothiazolinone with edematous square with erythema and fine papules  Plan:  Allergic contact dermatitis The patient has been provided detailed information regarding the substances she is sensitive to, as well as products containing the substances.  She will return in 2 days for final read. We'll plan to email the safe list product list to her email at [email or web address]   Prudy Feeler, MD [facility]

## 2020-09-30 ENCOUNTER — Ambulatory Visit: Payer: PPO | Admitting: Family

## 2020-09-30 ENCOUNTER — Other Ambulatory Visit: Payer: Self-pay

## 2020-09-30 ENCOUNTER — Encounter: Payer: Self-pay | Admitting: Family

## 2020-09-30 DIAGNOSIS — L2389 Allergic contact dermatitis due to other agents: Secondary | ICD-10-CM

## 2020-09-30 NOTE — Progress Notes (Signed)
Sheryl Schmidt returns to the office today for the final patch test interpretation, given suspected history of contact dermatitis.    Diagnostics:   TRUE TEST 96-hour hour reading: negative reaction to #1 (Nickel Sulfate), negative reaction to #2 (Wool Alcohols), negative reaction to #3 (Neomycin sulfate), negative reaction to #4 (Potassium dichromate), negative reaction to #5 [employer] mix), negative reaction to  #6 (Fragrance mix), negative reaction to #7 (Colophony), negative reaction to #9 (Paraben mix), negative reaction to #10 (Balsam of [place]), negative reaction to #11 (Ethylenediamine dihydrochloride), negative reaction to #12 (Cobalt chloride), negative reaction to #13 (p-tert Butylphenol formaldehyde resin), negative reaction to #14 (Epoxy resin), negative reaction to #15 (Carba mix), negative reaction to #16 (Black rubber mix), strong positive reaction reaction to #17 (Cl+Me-Isothiazolinone), negative reaction to #18 (Quaternium-15), negative reaction to #19 (Methyldibromo Glutaronitrile), negative reaction to #20 (p-Phenylene-diamine), negative reaction to #21 (Formaldehyde), negative reaction to #22 (Mercapto mix), negative reaction to #23 (Thiomersal), negative reaction to #24 (Thiuram mix), negative reaction to #25 (Diazolidinyl urea), negative reaction to #26 (Quinoline mix), negative reaction to #27 (Tixocortol-21-pivalate), negative reaction to #28 (Gold sodium thiosulfate), negative reaction to #29 (Imidazolidinyl urea), negative reaction to #30 (Budesonide), negative reaction to #31 (Hydrocortisone-17-butyrate), negative reaction to #32 (Mercapto-benzothiazole), negative reaction to #33 (Bacitracin), negative reaction to #34 (Parthenolide), negative reaction to #35 (Disperse blue) and negative reaction to #36 (Bronopol)  Plan:   Allergic contact dermatitis - The patient has been provided detailed information regarding the substances she is sensitive to, as well as products containing the  substances.   - Meticulous avoidance of these substances is recommended.  - If avoidance is not possible, the use of barrier creams or lotions is recommended. - If symptoms persist or progress despite meticulous avoidance of Cl+ Me-Isothiazolinone, Dermatology Referral may be warranted.  Thank you for the opportunity to care for [facility].  Please let us know if this treatment plan is not working well for you.  Althea Charon, FNP [facility]

## 2020-12-06 ENCOUNTER — Other Ambulatory Visit: Payer: Self-pay | Admitting: Obstetrics and Gynecology

## 2020-12-06 DIAGNOSIS — Z1231 Encounter for screening mammogram for malignant neoplasm of breast: Secondary | ICD-10-CM

## 2020-12-26 DIAGNOSIS — L814 Other melanin hyperpigmentation: Secondary | ICD-10-CM | POA: Diagnosis not present

## 2020-12-26 DIAGNOSIS — L308 Other specified dermatitis: Secondary | ICD-10-CM | POA: Diagnosis not present

## 2020-12-26 DIAGNOSIS — Z85828 Personal history of other malignant neoplasm of skin: Secondary | ICD-10-CM | POA: Diagnosis not present

## 2020-12-26 DIAGNOSIS — D2272 Melanocytic nevi of left lower limb, including hip: Secondary | ICD-10-CM | POA: Diagnosis not present

## 2020-12-26 DIAGNOSIS — L821 Other seborrheic keratosis: Secondary | ICD-10-CM | POA: Diagnosis not present

## 2020-12-26 DIAGNOSIS — D2271 Melanocytic nevi of right lower limb, including hip: Secondary | ICD-10-CM | POA: Diagnosis not present

## 2020-12-28 DIAGNOSIS — U071 COVID-19: Secondary | ICD-10-CM | POA: Diagnosis not present

## 2020-12-29 ENCOUNTER — Telehealth (HOSPITAL_COMMUNITY): Payer: Self-pay | Admitting: Pharmacist

## 2020-12-29 ENCOUNTER — Other Ambulatory Visit (HOSPITAL_COMMUNITY): Payer: Self-pay | Admitting: Infectious Diseases

## 2020-12-29 ENCOUNTER — Telehealth: Payer: Self-pay | Admitting: Infectious Diseases

## 2020-12-29 MED ORDER — NIRMATRELVIR/RITONAVIR (PAXLOVID)TABLET: 3.0000 | ORAL_TABLET | Freq: Two times a day (BID) | ORAL | 0 refills | Status: DC

## 2020-12-29 MED FILL — PAXLOVID 20 X 150 MG & 10 X: 20 X 150 MG | 5 days supply | Qty: 30 | Fill #0

## 2020-12-29 NOTE — Telephone Encounter (Signed)
Patient was prescribed oral covid treatment PAXLOVID and treatment note was reviewed. Medication has been received by Trilby and reviewed for appropriateness.  Drug Interactions or Dosage Adjustments Noted: Pt CrCL was calculated to be 85ml/min, no need for renal dose adjustments.  No drug interactions noted, pt was advised to monitor blood pressure because of amlodipine  Delivery Method: pts sister will pick up  Patient contacted for counseling on 12/29/2020 and verbalized understanding.   Delivery or Pick-Up Date: 12/29/2020  Deidre Ala 12/29/2020, 1:58 PM [facility] Outpatient Pharmacist Phone# [phone]

## 2020-12-29 NOTE — Telephone Encounter (Signed)
Outpatient Oral COVID Treatment Note  I connected with Sheryl Schmidt on 12/29/2020/12:52 PM by telephone and verified that I am speaking with the correct person using two identifiers.  I discussed the limitations, risks, security, and privacy concerns of performing an evaluation and management service by telephone and the availability of in person appointments. I also discussed with the patient that there may be a patient responsible charge related to this service. The patient expressed understanding and agreed to proceed.  Patient location: [facility]  Provider location: [facility]   Diagnosis: COVID-19 infection  Purpose of visit: Discussion of potential use of Molnupiravir or Paxlovid, a new treatment for mild to moderate COVID-19 viral infection in non-hospitalized patients.   Subjective: Patient is a 70 y.o. female who has been diagnosed with COVID 19 viral infection.  Their symptoms began on 12/26/2020 with 12/28/2020 at her doctor's office.    Past Medical History:  Diagnosis Date  . Allergy   . Cholelithiasis   . Hypertension   . Internal hemorrhoid     Allergies  Allergen Reactions  . Sulfonamide Derivatives     REACTION: rash     Current Outpatient Medications:  .  amLODipine (NORVASC) 5 MG tablet, Take 5 mg by mouth daily., Disp: , Rfl:  .  Ascorbic Acid (VITAMIN C) 1000 MG tablet, Take 1,000 mg by mouth in the morning and at bedtime. , Disp: , Rfl:  .  aspirin 81 MG chewable tablet, Chew 81 mg by mouth daily., Disp: , Rfl:  .  Coenzyme Q10 (COQ10) 200 MG CAPS, Take 1 capsule by mouth in the morning and at bedtime. , Disp: , Rfl:  .  folic acid (FOLVITE) 235 MCG tablet, Take 800 mcg by mouth daily. , Disp: , Rfl:  .  Garlic 3614 MG CAPS, Take 1 capsule by mouth daily., Disp: , Rfl:  .  Krill Oil 500 MG CAPS, Take 1 capsule by mouth in the morning and at bedtime., Disp: , Rfl:  .  MAGNESIUM PO, Take 250 mg by mouth daily., Disp: , Rfl:  .  TURMERIC CURCUMIN PO, Take 900 mg  by mouth daily. , Disp: , Rfl:  .  VITAMIN D PO, Take by mouth in the morning and at bedtime., Disp: , Rfl:   Objective: Patient appears/sounds mildly ill on the phone.  They are in no apparent distress.  Breathing is non labored.  Mood and behavior are normal.  Laboratory Data:  No results found for this or any previous visit (from the past 2160 hour(s)).   Assessment: 70 y.o. female with mild/moderate COVID 19 viral infection diagnosed on 12/28/2020 at high risk for progression to severe COVID 19.  Plan:  This patient is a 70 y.o. female that meets the following criteria for Emergency Use Authorization of: Paxlovid 1. Age >12 yr AND > 40 kg 2. SARS-COV-2 positive test 3. Symptom onset < 5 days 4. Mild-to-moderate COVID disease with high risk for severe progression to hospitalization or death  I have spoken and communicated the following to the patient or parent/caregiver regarding: 1. Paxlovid is an unapproved drug that is authorized for use under an Emergency Use Authorization.  2. There are no adequate, approved, available products for the treatment of COVID-19 in adults who have mild-to-moderate COVID-19 and are at high risk for progressing to severe COVID-19, including hospitalization or death. 3. Other therapeutics are currently authorized. For additional information on all products authorized for treatment or prevention of COVID-19, please see [employer].  4.  There are benefits and risks of taking this treatment as outlined in the "Fact Sheet for Patients and Caregivers."  5. "Fact Sheet for Patients and Caregivers" was reviewed with patient. A hard copy will be provided to patient from pharmacy prior to the patient receiving treatment. 6. Patients should continue to self-isolate and use infection control measures (e.g., wear mask, isolate, social distance, avoid sharing  personal items, clean and disinfect "high touch" surfaces, and frequent handwashing) according to CDC guidelines.  7. The patient or parent/caregiver has the option to accept or refuse treatment. 8. Patient medication history was reviewed for potential drug interactions:No drug interactions 9. Patient's creatinine clearance was calculated to be 89 mL/min, and they were therefore prescribed Normal dose (CrCl>60) - nirmatrelvir 150mg  tab (2 tablet) by mouth twice daily AND ritonavir 100mg  tab (1 tablet) by mouth twice daily   After reviewing above information with the patient, the patient agrees to receive Paxlovid.  Follow up instructions:    . Take prescription BID x 5 days as directed . Reach out to pharmacist for counseling on medication if desired . For concerns regarding further COVID symptoms please follow up with your PCP or urgent care . For urgent or life-threatening issues, seek care at your local emergency department  The patient was provided an opportunity to ask questions, and all were answered. The patient agreed with the plan and demonstrated an understanding of the instructions.   Script sent to [facility] and opted to pick up RX.  The patient was advised to call their PCP or seek an in-person evaluation if the symptoms worsen or if the condition fails to improve as anticipated.   I provided 9 minutes of non face-to-face telephone visit time during this encounter, and > 50% was spent counseling as documented under my assessment & plan.  [name], NP 12/29/2020 /12:52 PM

## 2020-12-31 ENCOUNTER — Emergency Department (HOSPITAL_COMMUNITY): Payer: PPO

## 2020-12-31 ENCOUNTER — Emergency Department (HOSPITAL_COMMUNITY)
Admission: EM | Admit: 2020-12-31 | Discharge: 2020-12-31 | Disposition: A | Discharge status: Home / Self Care | Payer: PPO | Attending: Emergency Medicine | Admitting: Emergency Medicine

## 2020-12-31 ENCOUNTER — Other Ambulatory Visit: Payer: Self-pay

## 2020-12-31 ENCOUNTER — Encounter (HOSPITAL_COMMUNITY): Payer: Self-pay | Admitting: Emergency Medicine

## 2020-12-31 DIAGNOSIS — Z79899 Other long term (current) drug therapy: Secondary | ICD-10-CM | POA: Insufficient documentation

## 2020-12-31 DIAGNOSIS — I10 Essential (primary) hypertension: Secondary | ICD-10-CM | POA: Diagnosis not present

## 2020-12-31 DIAGNOSIS — Z7982 Long term (current) use of aspirin: Secondary | ICD-10-CM | POA: Insufficient documentation

## 2020-12-31 DIAGNOSIS — U071 COVID-19: Secondary | ICD-10-CM | POA: Diagnosis not present

## 2020-12-31 DIAGNOSIS — R531 Weakness: Secondary | ICD-10-CM | POA: Diagnosis present

## 2020-12-31 DIAGNOSIS — R059 Cough, unspecified: Secondary | ICD-10-CM | POA: Diagnosis not present

## 2020-12-31 MED ORDER — PROMETHAZINE-DM 6.25-15 MG/5ML PO SYRP: 5.0000 mL | ORAL_SOLUTION | Freq: Four times a day (QID) | ORAL | 0 refills | Status: DC | PRN

## 2020-12-31 MED ORDER — BENZONATATE 100 MG PO CAPS: 100.0000 mg | ORAL_CAPSULE | Freq: Three times a day (TID) | ORAL | 0 refills | Status: DC

## 2020-12-31 MED ORDER — ONDANSETRON 4 MG PO TBDP: 4.0000 mg | ORAL_TABLET | Freq: Three times a day (TID) | ORAL | 0 refills | Status: AC | PRN

## 2020-12-31 MED ORDER — ACETAMINOPHEN 325 MG PO TABS
650.0000 mg | ORAL_TABLET | Freq: Once | ORAL | Status: AC
  Administered 2020-12-31: 650 mg via ORAL
  Filled 2020-12-31: qty 2

## 2020-12-31 NOTE — ED Triage Notes (Signed)
Patient states she tested COVID + on Monday, 12/26/2020, she is unvaccinated. States she feels weak, states she has taken Tylenol and tried to drink fluids but nothing tastes right. States she got a home oxygen of 89%.

## 2020-12-31 NOTE — ED Provider Notes (Signed)
[facility] DEPT Provider Note   CSN: [id number] Arrival date & time: 12/31/20  1311     History Chief Complaint  Patient presents with  . Cough  . Fatigue  . Covid Positive    Sheryl Laurence is a 70 y.o. female.  HPI Patient is a 70 year old female past medical history significant for allergies, cholelithiasis, HTN, hemorrhoids  Patient is unvaccinated. She does not positive for COVID-19 on Monday. She states that she feels weak, fatigued, she states that she has changes in her sense of taste and smell. She was prescribed Paxlovid by her primary care doctor states that she still does not have normal taste and smell. She states that she also noticed that her pulse oximeter which she bought at over-the-counter briefly showed 89%. She states that it has been staying between 90-100% apart from a brief moment where flickered to [id number]. She states she feels short of breath primarily because she feels very congested.  She denies any nausea or vomiting or diarrhea. No chest pain. No headache or dizziness.  No other associate symptoms. No aggravating mitigating factors.  She has taken Tylenol for his symptoms yesterday but none/no meds today.     Past Medical History:  Diagnosis Date  . Allergy   . Cholelithiasis   . Hypertension   . Internal hemorrhoid     Patient Active Problem List   Diagnosis Date Noted  . 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 Symptomatic cholelithiasis Status post laparoscopic cholecystectomy 02/18/2014 02/10/2014  . HYPERTENSION 01/26/2008  . GERD 01/26/2008  . 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 ALLERGY 01/26/2008    Past Surgical History:  Procedure Laterality Date  . BACK SURGERY  2011   removed L1 and removed a rib  . CESAREAN SECTION    . CHOLECYSTECTOMY N/A 02/18/2014   Procedure: LAPAROSCOPIC CHOLECYSTECTOMY WITH INTRAOPERATIVE CHOLANGIOGRAM;  Surgeon: Odis Hollingshead, MD;  Location: [facility];  Service:  General;  Laterality: N/A;     OB History   No obstetric history on file.     Family History  Problem Relation Age of Onset  . COPD Father   . Heart disease Father   . Colon cancer Maternal Uncle   . Diabetes Other   . Asthma Daughter   . Esophageal cancer Neg Hx   . Rectal cancer Neg Hx   . Stomach cancer Neg Hx     Social History   Tobacco Use  . Smoking status: Never Smoker  . Smokeless tobacco: Never Used  Vaping Use  . Vaping Use: Never used  Substance Use Topics  . Alcohol use: Not Currently    Comment: rarely  . Drug use: No    Home Medications Prior to Admission medications   Medication Sig Start Date End Date Taking? Authorizing Provider  benzonatate (TESSALON) 100 MG capsule Take 1 capsule (100 mg total) by mouth every 8 (eight) hours. 12/31/20  Yes Peregrine Nolt S, PA  ondansetron (ZOFRAN ODT) 4 MG disintegrating tablet Take 1 tablet (4 mg total) by mouth every 8 (eight) hours as needed for nausea or vomiting. 12/31/20  Yes Lakeem Rozo S, PA  promethazine-dextromethorphan (PROMETHAZINE-DM) 6.25-15 MG/5ML syrup Take 5 mLs by mouth 4 (four) times daily as needed for cough. 12/31/20  Yes Erland Vivas S, PA  amLODipine (NORVASC) 5 MG tablet Take 5 mg by mouth daily. 08/17/20   [provider]  Ascorbic Acid (VITAMIN C) 1000 MG tablet Take 1,000 mg by mouth in the  morning and at bedtime.     [provider]  aspirin 81 MG chewable tablet Chew 81 mg by mouth daily.    [provider]  Coenzyme Q10 (COQ10) 200 MG CAPS Take 1 capsule by mouth in the morning and at bedtime.     [provider]  folic acid (FOLVITE) 086 MCG tablet Take 800 mcg by mouth daily.     [provider]  Garlic 7619 MG CAPS Take 1 capsule by mouth daily.    [provider]  Astrid Drafts 500 MG CAPS Take 1 capsule by mouth in the morning and at bedtime.    [provider]  MAGNESIUM PO Take 250 mg by mouth daily.    [provider]  nirmatrelvir/ritonavir EUA (PAXLOVID) TABS Take 3 tablets by mouth 2 (two) times daily for 5 days. Take nirmatrelvir (150 mg) 2 tablet(s) twice daily for 5 days and ritonavir (100 mg) one tablet twice daily for 5 days. 12/29/20 01/03/21  [place] Callas, NP  TURMERIC CURCUMIN PO Take 900 mg by mouth daily.     [provider]  VITAMIN D PO Take by mouth in the morning and at bedtime.    [provider]    Allergies    Sulfonamide derivatives  Review of Systems   Review of Systems  Constitutional: Positive for appetite change, chills and fatigue. Negative for fever.  HENT: Negative for congestion.   Respiratory: Negative for shortness of breath.   Cardiovascular: Negative for chest pain.  Gastrointestinal: Negative for abdominal distention, nausea and vomiting.  Neurological: Negative for dizziness and headaches.    Physical Exam Updated Vital Signs BP 129/69   Pulse 83   Temp 99.2 F (37.3 C) (Oral)   Resp 16   Ht 5' 1.5" (1.562 m)   Wt 72.6 kg   SpO2 93%   BMI 29.74 kg/m   Physical Exam Vitals and nursing note reviewed.  Constitutional:      General: She is not in acute distress.    Comments: Well-appearing 70 year old female appears stated age.  In no acute distress. Speaking in full sentences.  Able answer questions appropriate follow commands  HENT:     Head: Normocephalic and atraumatic.     Nose: Nose normal.  Eyes:     General: No scleral icterus. Cardiovascular:     Rate and Rhythm: Normal rate and regular rhythm.     Pulses: Normal pulses.     Heart sounds: Normal heart sounds.  Pulmonary:     Effort: Pulmonary effort is normal. No respiratory distress.     Breath sounds: No wheezing.     Comments: Faint crackles auscultated on the right lung base No tachypnea, no increased work of breathing Abdominal:     Palpations: Abdomen is soft.     Tenderness: There is no abdominal tenderness.  Musculoskeletal:     Cervical  back: Normal range of motion.     Right lower leg: No edema.     Left lower leg: No edema.     Comments: No lower extremity edema.  No calf tenderness.  Skin:    General: Skin is warm and dry.     Capillary Refill: Capillary refill takes less than 2 seconds.  Neurological:     Mental Status: She is alert. Mental status is at baseline.  Psychiatric:        Mood and Affect: Mood normal.        Behavior: Behavior normal.  ED Results / Procedures / Treatments   Labs (all labs ordered are listed, but only abnormal results are displayed) Labs Reviewed - No data to display  EKG None  Radiology DG Chest Portable 1 View  Result Date: 12/31/2020 CLINICAL DATA:  70 year old female with cough EXAM: PORTABLE CHEST 1 VIEW COMPARISON:  02/17/2014 FINDINGS: Cardiomediastinal silhouette unchanged in size and contour. Low lung volumes. No pneumothorax or pleural effusion. No confluent airspace disease. Mildly coarsened interstitial markings, similar to the prior. Incompletely imaged surgical changes of the lower thoracic/lumbar spine. IMPRESSION: Negative for acute cardiopulmonary disease Electronically Signed   By: Corrie Mckusick D.O.   On: 12/31/2020 14:54    Procedures Procedures   Medications Ordered in ED Medications  acetaminophen (TYLENOL) tablet 650 mg (650 mg Oral Given 12/31/20 1350)    ED Course  I have reviewed the triage vital signs and the nursing notes.  Pertinent labs & imaging results that were available during my care of the patient were reviewed by me and considered in my medical decision making (see chart for details).    MDM Rules/Calculators/A&P                          Patient 70 year old female tested positive for COVID-19 on Monday she has had symptoms since symptoms discussed in detail in HPI consist primarily of fatigue congestion and malaise.  She has had no fevers.  She states shortness of breath primarily due to congestion in his sinuses.  She does have on  physical exam some crackles in right lung base.  Chest x-ray reviewed does not show any evidence of focal pneumonia.  Vital signs within normal limits.  Patient ambulated her SPO2 remained between 95 and 100% on room air.  Do not appear overly dyspneic.  Some mild tachycardia with ambulation.  Patient given return precautions.  She has no chest pain I have low suspicion for PE.  Suspect that this is symptoms related to patient's COVID-19 given that she is unvaccinated.  She has been prescribed antiviral by her primary care doctor however she has not taken any of this she has decided against it.  I recommend following up with the [facility] clinic as well versus PCP.  Patient prescribed antitussives, given Tylenol ibuprofen recommendations, also given Zofran in case she has any nausea.  Final Clinical Impression(s) / ED Diagnoses Final diagnoses:  [id number]    Rx / DC Orders ED Discharge Orders         Ordered    benzonatate (TESSALON) 100 MG capsule  Every 8 hours        12/31/20 1506    promethazine-dextromethorphan (PROMETHAZINE-DM) 6.25-15 MG/5ML syrup  4 times daily PRN        12/31/20 1506    ondansetron (ZOFRAN ODT) 4 MG disintegrating tablet  Every 8 hours PRN        12/31/20 1506           Pati Gallo [place], [place] 12/31/20 1518    Dorie Rank, MD 01/01/21 [phone]

## 2020-12-31 NOTE — ED Notes (Signed)
Pt ambulating for 2 minutes. While ambulating, O2 did not drop below 95% and HR was 100-112.

## 2020-12-31 NOTE — Discharge Instructions (Addendum)
You have COVID 19. This is a self-limited illness in most cases. As you are unvaccinated your symptoms are expected to last longer than vaccinated individuals.  In the meantime follow CDC guidelines and quarantine, wear a mask, wash hands often.   Please use Tylenol or ibuprofen for pain.  You may use 600 mg ibuprofen every 6 hours or 1000 mg of Tylenol every 6 hours.  You may choose to alternate between the 2.  This would be most effective.  Not to exceed 4 g of Tylenol within 24 hours.  Not to exceed 3200 mg ibuprofen 24 hours.  Please take over the counter vitamin D 2000-4000 units per day. I also recommend zinc 50 mg per day for the next two weeks.   Please return to ED if you feel have worsening difficulty breathing or have emergent, new or concerning symptoms.  Patients who have symptoms consistent with COVID-19 should self isolated for: At least 3 days (72 hours) have passed since recovery, defined as resolution of fever without the use of fever reducing medications and improvement in respiratory symptoms (e.g., cough, shortness of breath), and At least 7 days have passed since symptoms first appeared.       Person Under Monitoring Name: Sheryl Schmidt  Location: [place] [address]   Infection Prevention Recommendations for Individuals Confirmed to have, or Being Evaluated for, 2019 Novel Coronavirus (COVID-19) Infection Who Receive Care at Home  Individuals who are confirmed to have, or are being evaluated for, COVID-19 should follow the prevention steps below until a healthcare provider or local or state health department says they can return to normal activities.  Stay home except to get medical care You should restrict activities outside your home, except for getting medical care. Do not go to work, school, or public areas, and do not use public transportation or taxis.  Call ahead before visiting your doctor Before your medical appointment,  call the healthcare provider and tell them that you have, or are being evaluated for, COVID-19 infection. This will help the healthcare providers office take steps to keep other people from getting infected. Ask your healthcare provider to call the local or state health department.  Monitor your symptoms Seek prompt medical attention if your illness is worsening (e.g., difficulty breathing). Before going to your medical appointment, call the healthcare provider and tell them that you have, or are being evaluated for, COVID-19 infection. Ask your healthcare provider to call the local or state health department.  Wear a facemask You should wear a facemask that covers your nose and mouth when you are in the same room with other people and when you visit a healthcare provider. People who live with or visit you should also wear a facemask while they are in the same room with you.  Separate yourself from other people in your home As much as possible, you should stay in a different room from other people in your home. Also, you should use a separate bathroom, if available.  Avoid sharing household items You should not share dishes, drinking glasses, cups, eating utensils, towels, bedding, or other items with other people in your home. After using these items, you should wash them thoroughly with soap and water.  Cover your coughs and sneezes Cover your mouth and nose with a tissue when you cough or sneeze, or you can cough or sneeze into your sleeve. Throw used tissues in a lined trash can, and immediately wash your hands with soap and  water for at least 20 seconds or use an alcohol-based hand rub.  Wash your [facility] your hands often and thoroughly with soap and water for at least 20 seconds. You can use an alcohol-based hand sanitizer if soap and water are not available and if your hands are not visibly dirty. Avoid touching your eyes, nose, and mouth with unwashed hands.   Prevention  Steps for Caregivers and Household Members of Individuals Confirmed to have, or Being Evaluated for, COVID-19 Infection Being Cared for in the Home  If you live with, or provide care at home for, a person confirmed to have, or being evaluated for, COVID-19 infection please follow these guidelines to prevent infection:  Follow healthcare providers instructions Make sure that you understand and can help the patient follow any healthcare provider instructions for all care.  Provide for the patients basic needs You should help the patient with basic needs in the home and provide support for getting groceries, prescriptions, and other personal needs.  Monitor the patients symptoms If they are getting sicker, call his or her medical provider and tell them that the patient has, or is being evaluated for, COVID-19 infection. This will help the healthcare providers office take steps to keep other people from getting infected. Ask the healthcare provider to call the local or state health department.  Limit the number of people who have contact with the patient If possible, have only one caregiver for the patient. Other household members should stay in another home or place of residence. If this is not possible, they should stay in another room, or be separated from the patient as much as possible. Use a separate bathroom, if available. Restrict visitors who do not have an essential need to be in the home.  Keep older adults, very young children, and other sick people away from the patient Keep older adults, very young children, and those who have compromised immune systems or chronic health conditions away from the patient. This includes people with chronic heart, lung, or kidney conditions, diabetes, and cancer.  Ensure good ventilation Make sure that shared spaces in the home have good air flow, such as from an air conditioner or an opened window, weather permitting.  Wash your hands  often Wash your hands often and thoroughly with soap and water for at least 20 seconds. You can use an alcohol based hand sanitizer if soap and water are not available and if your hands are not visibly dirty. Avoid touching your eyes, nose, and mouth with unwashed hands. Use disposable paper towels to dry your hands. If not available, use dedicated cloth towels and replace them when they become wet.  Wear a facemask and gloves Wear a disposable facemask at all times in the room and gloves when you touch or have contact with the patients blood, body fluids, and/or secretions or excretions, such as sweat, saliva, sputum, nasal mucus, vomit, urine, or feces.  Ensure the mask fits over your nose and mouth tightly, and do not touch it during use. Throw out disposable facemasks and gloves after using them. Do not reuse. Wash your hands immediately after removing your facemask and gloves. If your personal clothing becomes contaminated, carefully remove clothing and launder. Wash your hands after handling contaminated clothing. Place all used disposable facemasks, gloves, and other waste in a lined container before disposing them with other household waste. Remove gloves and wash your hands immediately after handling these items.  Do not share dishes, glasses, or other household items  with the patient Avoid sharing household items. You should not share dishes, drinking glasses, cups, eating utensils, towels, bedding, or other items with a patient who is confirmed to have, or being evaluated for, COVID-19 infection. After the person uses these items, you should wash them thoroughly with soap and water.  Wash laundry thoroughly Immediately remove and wash clothes or bedding that have blood, body fluids, and/or secretions or excretions, such as sweat, saliva, sputum, nasal mucus, vomit, urine, or feces, on them. Wear gloves when handling laundry from the patient. Read and follow directions on labels of  laundry or clothing items and detergent. In general, wash and dry with the warmest temperatures recommended on the label.  Clean all areas the individual has used often Clean all touchable surfaces, such as counters, tabletops, doorknobs, bathroom fixtures, toilets, phones, keyboards, tablets, and bedside tables, every day. Also, clean any surfaces that may have blood, body fluids, and/or secretions or excretions on them. Wear gloves when cleaning surfaces the patient has come in contact with. Use a diluted bleach solution (e.g., dilute bleach with 1 part bleach and 10 parts water) or a household disinfectant with a label that says EPA-registered for coronaviruses. To make a bleach solution at home, add 1 tablespoon of bleach to 1 quart (4 cups) of water. For a larger supply, add  cup of bleach to 1 gallon (16 cups) of water. Read labels of cleaning products and follow recommendations provided on product labels. Labels contain instructions for safe and effective use of the cleaning product including precautions you should take when applying the product, such as wearing gloves or eye protection and making sure you have good ventilation during use of the product. Remove gloves and wash hands immediately after cleaning.  Monitor yourself for signs and symptoms of illness Caregivers and household members are considered close contacts, should monitor their health, and will be asked to limit movement outside of the home to the extent possible. Follow the monitoring steps for close contacts listed on the symptom monitoring form.   ? If you have additional questions, contact your local health department or call the epidemiologist on call at [phone] (available 24/7). ? This guidance is subject to change. For the most up-to-date guidance from [facility], please refer to their website: [email or web address]

## 2021-01-02 ENCOUNTER — Emergency Department (HOSPITAL_BASED_OUTPATIENT_CLINIC_OR_DEPARTMENT_OTHER): Payer: PPO

## 2021-01-02 ENCOUNTER — Other Ambulatory Visit: Payer: Self-pay

## 2021-01-02 ENCOUNTER — Encounter (HOSPITAL_BASED_OUTPATIENT_CLINIC_OR_DEPARTMENT_OTHER): Payer: Self-pay | Admitting: *Deleted

## 2021-01-02 ENCOUNTER — Emergency Department (HOSPITAL_BASED_OUTPATIENT_CLINIC_OR_DEPARTMENT_OTHER)
Admission: EM | Admit: 2021-01-02 | Discharge: 2021-01-03 | Disposition: A | Discharge status: Home / Self Care | Payer: PPO | Attending: Emergency Medicine | Admitting: Emergency Medicine

## 2021-01-02 DIAGNOSIS — Z79899 Other long term (current) drug therapy: Secondary | ICD-10-CM | POA: Insufficient documentation

## 2021-01-02 DIAGNOSIS — J1282 Pneumonia due to coronavirus disease 2019: Secondary | ICD-10-CM | POA: Diagnosis not present

## 2021-01-02 DIAGNOSIS — R059 Cough, unspecified: Secondary | ICD-10-CM | POA: Diagnosis present

## 2021-01-02 DIAGNOSIS — R0902 Hypoxemia: Secondary | ICD-10-CM | POA: Diagnosis not present

## 2021-01-02 DIAGNOSIS — Z7982 Long term (current) use of aspirin: Secondary | ICD-10-CM | POA: Insufficient documentation

## 2021-01-02 DIAGNOSIS — I10 Essential (primary) hypertension: Secondary | ICD-10-CM | POA: Insufficient documentation

## 2021-01-02 DIAGNOSIS — U071 COVID-19: Secondary | ICD-10-CM | POA: Diagnosis not present

## 2021-01-02 NOTE — ED Notes (Signed)
Daughter informed this RN pt has been taking Ivermectin for Covid and had a lot of nausea and vomiting.

## 2021-01-02 NOTE — ED Triage Notes (Signed)
Pt reports positive covid on Monday 12-26-20. States that she is feeling weak and has a decreased appetite. Ambulatory, maintained O2 sats of 91% while ambulating from lobby to triage.

## 2021-01-02 NOTE — ED Notes (Signed)
COVID SWAB OBTAINED AND TO THE LAB 

## 2021-01-02 NOTE — ED Notes (Signed)
One week ago began feeling like she had a head cold, then began feeling very weak, very fatigued, appetite poor. Home Covid Test done last Tuesday, which resulted as positive.

## 2021-01-02 NOTE — ED Notes (Signed)
This past Saturday went to [facility] ED, due to feeling weak and not eating well, states rec'd cough meds and nausea meds.

## 2021-01-03 ENCOUNTER — Telehealth (HOSPITAL_BASED_OUTPATIENT_CLINIC_OR_DEPARTMENT_OTHER): Payer: Self-pay | Admitting: Emergency Medicine

## 2021-01-03 ENCOUNTER — Telehealth (HOSPITAL_COMMUNITY): Payer: Self-pay

## 2021-01-03 ENCOUNTER — Encounter: Payer: Self-pay | Admitting: Nurse Practitioner

## 2021-01-03 DIAGNOSIS — Z789 Other specified health status: Secondary | ICD-10-CM | POA: Insufficient documentation

## 2021-01-03 LAB — CBC WITH DIFFERENTIAL/PLATELET
Abs Immature Granulocytes: 0.03 10*3/uL (ref 0.00–0.07)
Basophils Absolute: 0 10*3/uL (ref 0.0–0.1)
Basophils Relative: 0 %
Eosinophils Absolute: 0 10*3/uL (ref 0.0–0.5)
Eosinophils Relative: 0 %
HCT: 38.8 % (ref 36.0–46.0)
Hemoglobin: 13.1 g/dL (ref 12.0–15.0)
Immature Granulocytes: 0 %
Lymphocytes Relative: 25 %
Lymphs Abs: 1.7 10*3/uL (ref 0.7–4.0)
MCH: 28.5 pg (ref 26.0–34.0)
MCHC: 33.8 g/dL (ref 30.0–36.0)
MCV: 84.5 fL (ref 80.0–100.0)
Monocytes Absolute: 0.7 10*3/uL (ref 0.1–1.0)
Monocytes Relative: 10 %
Neutro Abs: 4.4 10*3/uL (ref 1.7–7.7)
Neutrophils Relative %: 65 %
Platelets: 236 10*3/uL (ref 150–400)
RBC: 4.59 MIL/uL (ref 3.87–5.11)
RDW: 12.9 % (ref 11.5–15.5)
WBC: 6.9 10*3/uL (ref 4.0–10.5)
nRBC: 0 % (ref 0.0–0.2)

## 2021-01-03 LAB — COMPREHENSIVE METABOLIC PANEL
ALT: 32 U/L (ref 0–44)
AST: 46 U/L — ABNORMAL HIGH (ref 15–41)
Albumin: 2.8 g/dL — ABNORMAL LOW (ref 3.5–5.0)
Alkaline Phosphatase: 65 U/L (ref 38–126)
Anion gap: 11 (ref 5–15)
BUN: 17 mg/dL (ref 8–23)
CO2: 27 mmol/L (ref 22–32)
Calcium: 8.1 mg/dL — ABNORMAL LOW (ref 8.9–10.3)
Chloride: 97 mmol/L — ABNORMAL LOW (ref 98–111)
Creatinine, Ser: 0.73 mg/dL (ref 0.44–1.00)
GFR, Estimated: >60 mL/min (ref >60)
Glucose, Bld: 96 mg/dL (ref 70–99)
Potassium: 3.5 mmol/L (ref 3.5–5.1)
Sodium: 135 mmol/L (ref 135–145)
Total Bilirubin: 0.5 mg/dL (ref 0.3–1.2)
Total Protein: 6.4 g/dL — ABNORMAL LOW (ref 6.5–8.1)

## 2021-01-03 LAB — PROCALCITONIN: Procalcitonin: <0.1 ng/mL

## 2021-01-03 LAB — D-DIMER, QUANTITATIVE: D-Dimer, Quant: 0.63 ug/mL-FEU — ABNORMAL HIGH (ref 0.00–0.50)

## 2021-01-03 LAB — C-REACTIVE PROTEIN: CRP: 7.5 mg/dL — ABNORMAL HIGH (ref <1.0)

## 2021-01-03 LAB — SARS CORONAVIRUS 2 BY RT PCR (HOSPITAL ORDER, PERFORMED IN ~~LOC~~ HOSPITAL LAB): SARS Coronavirus 2: POSITIVE — AB

## 2021-01-03 MED ORDER — SODIUM CHLORIDE 0.9 % IV SOLN
100.0000 mg | INTRAVENOUS | Status: AC
  Administered 2021-01-03 (×2): 100 mg via INTRAVENOUS

## 2021-01-03 MED ORDER — METHYLPREDNISOLONE SODIUM SUCC 125 MG IJ SOLR
125.0000 mg | Freq: Once | INTRAMUSCULAR | Status: AC
  Administered 2021-01-03: 125 mg via INTRAVENOUS
  Filled 2021-01-03: qty 2

## 2021-01-03 NOTE — Telephone Encounter (Signed)
RN called patient & scheduled for outpatient Remdesivir infusions at 4:30pm on Wednesday 2/2 and Thursday 2/3 at [facility]. Please inform the patient to park at [place], as staff will be escorting the patient through the [place] entrance of the hospital. Appointments take approximately 45 minutes.    There is a wave flag banner located near the entrance on [address]. Turn into this entrance and immediately turn left or right and park in 1 of the 10 designated Covid Infusion Parking spots. There is a phone number on the sign, please call and let the staff know what spot you are in and we will come out and get you. For questions call [phone].  Thanks.

## 2021-01-03 NOTE — ED Provider Notes (Signed)
[facility] Provider Note: Georgena Spurling, MD, FACEP  CSN: [id number] MRN: [id number] ARRIVAL: 01/02/21 at 2134 ROOM: [place]  Covid Positive   HISTORY OF PRESENT ILLNESS  01/03/21 4:17 AM Aldena Worm is a 70 y.o. female who tested positive for Covid 8 days ago.  She was seen in the ED on 12/31/2020 with weakness, fatigue, cough, alteration of taste and smell.  She was prescribed Paxil of it by her PCP but she did not take it.  She had not been vaccinated for COVID-19.  She has had oxygen saturations that sometimes drop as low as 89% and she feels short of breath and congested.  Her symptoms are moderate and worse with exertion.  She also has a decreased appetite.    Past Medical History:  Diagnosis Date  . Allergy   . Cholelithiasis   . Hypertension   . Internal hemorrhoid     Past Surgical History:  Procedure Laterality Date  . BACK SURGERY  2011   removed L1 and removed a rib  . CESAREAN SECTION    . CHOLECYSTECTOMY N/A 02/18/2014   Procedure: LAPAROSCOPIC CHOLECYSTECTOMY WITH INTRAOPERATIVE CHOLANGIOGRAM;  Surgeon: Odis Hollingshead, MD;  Location: [facility] ORS;  Service: General;  Laterality: N/A;    Family History  Problem Relation Age of Onset  . COPD Father   . Heart disease Father   . Colon cancer Maternal Uncle   . Diabetes Other   . Asthma Daughter   . Esophageal cancer Neg Hx   . Rectal cancer Neg Hx   . Stomach cancer Neg Hx     Social History   Tobacco Use  . Smoking status: Never Smoker  . Smokeless tobacco: Never Used  Vaping Use  . Vaping Use: Never used  Substance Use Topics  . Alcohol use: Not Currently    Comment: rarely  . Drug use: No    Prior to Admission medications   Medication Sig Start Date End Date Taking? Authorizing Provider  amLODipine (NORVASC) 5 MG tablet Take 5 mg by mouth daily. 08/17/20   [provider]  Ascorbic Acid (VITAMIN C) 1000 MG tablet Take 1,000 mg by mouth in the morning  and at bedtime.     [provider]  aspirin 81 MG chewable tablet Chew 81 mg by mouth daily.    [provider]  benzonatate (TESSALON) 100 MG capsule Take 1 capsule (100 mg total) by mouth every 8 (eight) hours. 12/31/20   Tedd Sias, PA  Coenzyme Q10 (COQ10) 200 MG CAPS Take 1 capsule by mouth in the morning and at bedtime.     [provider]  folic acid (FOLVITE) 097 MCG tablet Take 800 mcg by mouth daily.     [provider]  Garlic 3532 MG CAPS Take 1 capsule by mouth daily.    [provider]  Astrid Drafts 500 MG CAPS Take 1 capsule by mouth in the morning and at bedtime.    [provider]  MAGNESIUM PO Take 250 mg by mouth daily.    [provider]  ondansetron (ZOFRAN ODT) 4 MG disintegrating tablet Take 1 tablet (4 mg total) by mouth every 8 (eight) hours as needed for nausea or vomiting. 12/31/20   Tedd Sias, PA  promethazine-dextromethorphan (PROMETHAZINE-DM) 6.25-15 MG/5ML syrup Take 5 mLs by mouth 4 (four) times daily as needed for cough. 12/31/20   Tedd Sias, PA  TURMERIC CURCUMIN PO Take 900 mg  by mouth daily.     [provider]  VITAMIN D PO Take by mouth in the morning and at bedtime.    [provider]    Allergies Sulfonamide derivatives   REVIEW OF SYSTEMS  Negative except as noted here or in the History of Present Illness.   PHYSICAL EXAMINATION  Initial Vital Signs Blood pressure 103/68, pulse 73, temperature 97.6 F (36.4 C), temperature source Oral, resp. rate 16, height 5\' 1"  (1.549 m), weight 72.6 kg, SpO2 95 %.  Examination General: Well-developed, well-nourished female in no acute distress; appearance consistent with age of record HENT: normocephalic; atraumatic Eyes: pupils equal, round and reactive to light; extraocular muscles intact Neck: supple Heart: regular rate and rhythm Lungs: clear to auscultation bilaterally but somewhat shallow breaths;  occasional dry cough Abdomen: soft; nondistended; nontender; bowel sounds present Extremities: No deformity; full range of motion; pulses normal Neurologic: Awake, alert and oriented; motor function intact in all extremities and symmetric; no facial droop Skin: Warm and dry Psychiatric: Normal mood and affect   RESULTS  Summary of this visit's results, reviewed and interpreted by myself:   EKG Interpretation  Date/Time:    Ventricular Rate:    PR Interval:    QRS Duration:   QT Interval:    QTC Calculation:   R Axis:     Text Interpretation:        Laboratory Studies: Results for orders placed or performed during the hospital encounter of 01/02/21 (from the past 24 hour(s))  SARS Coronavirus 2 by RT PCR (hospital order, performed in [place] hospital lab) Nasopharyngeal Nasopharyngeal Swab     Status: Abnormal   Collection Time: 01/02/21 10:30 PM   Specimen: Nasopharyngeal Swab  Result Value Ref Range   SARS Coronavirus 2 POSITIVE (A) NEGATIVE  CBC with Differential/Platelet     Status: None   Collection Time: 01/03/21  1:05 AM  Result Value Ref Range   WBC 6.9 4.0 - 10.5 K/uL   RBC 4.59 3.87 - 5.11 MIL/uL   Hemoglobin 13.1 12.0 - 15.0 g/dL   HCT 38.8 36.0 - 46.0 %   MCV 84.5 80.0 - 100.0 fL   MCH 28.5 26.0 - 34.0 pg   MCHC 33.8 30.0 - 36.0 g/dL   RDW 12.9 11.5 - 15.5 %   Platelets 236 150 - 400 K/uL   nRBC 0.0 0.0 - 0.2 %   Neutrophils Relative % 65 %   Neutro Abs 4.4 1.7 - 7.7 K/uL   Lymphocytes Relative 25 %   Lymphs Abs 1.7 0.7 - 4.0 K/uL   Monocytes Relative 10 %   Monocytes Absolute 0.7 0.1 - 1.0 K/uL   Eosinophils Relative 0 %   Eosinophils Absolute 0.0 0.0 - 0.5 K/uL   Basophils Relative 0 %   Basophils Absolute 0.0 0.0 - 0.1 K/uL   Immature Granulocytes 0 %   Abs Immature Granulocytes 0.03 0.00 - 0.07 K/uL  Comprehensive metabolic panel     Status: Abnormal   Collection Time: 01/03/21  1:05 AM  Result Value Ref Range   Sodium 135 135 - 145 mmol/L    Potassium 3.5 3.5 - 5.1 mmol/L   Chloride 97 (L) 98 - 111 mmol/L   CO2 27 22 - 32 mmol/L   Glucose, Bld 96 70 - 99 mg/dL   BUN 17 8 - 23 mg/dL   Creatinine, Ser 0.73 0.44 - 1.00 mg/dL   Calcium 8.1 (L) 8.9 - 10.3 mg/dL   Total Protein 6.4 (  L) 6.5 - 8.1 g/dL   Albumin 2.8 (L) 3.5 - 5.0 g/dL   AST 46 (H) 15 - 41 U/L   ALT 32 0 - 44 U/L   Alkaline Phosphatase 65 38 - 126 U/L   Total Bilirubin 0.5 0.3 - 1.2 mg/dL   GFR, Estimated >60 >60 mL/min   Anion gap 11 5 - 15  D-dimer, quantitative (not at [facility])     Status: Abnormal   Collection Time: 01/03/21  1:05 AM  Result Value Ref Range   D-Dimer, Quant 0.63 (H) 0.00 - 0.50 ug/mL-FEU   Imaging Studies: DG Chest Port 1 View  Result Date: 01/02/2021 CLINICAL DATA:  Hypoxia and COVID-19 positivity EXAM: PORTABLE CHEST 1 VIEW COMPARISON:  12/31/2020 FINDINGS: Cardiac shadow is within normal limits. Lungs are well aerated bilaterally. Increased patchy airspace opacities noted slightly worse on the right shows a left consistent with the given clinical history of COVID-19 positivity. Postsurgical changes in the thoracolumbar junction are noted. IMPRESSION: Patchy airspace opacities consistent with the given clinical history. Electronically Signed   By: Inez Catalina M.D.   On: 01/02/2021 23:05    ED COURSE and MDM  Nursing notes, initial and subsequent vitals signs, including pulse oximetry, reviewed and interpreted by myself.  Vitals:   01/03/21 0030 01/03/21 0113 01/03/21 0225 01/03/21 0336  BP: 132/80 (!) 123/94 128/74 103/68  Pulse: 87 89 82 73  Resp: 18 16 16 16   Temp:      TempSrc:      SpO2: 94% 96% 96% 95%  Weight:      Height:       Medications  methylPREDNISolone sodium succinate (SOLU-MEDROL) 125 mg/2 mL injection 125 mg (has no administration in time range)    Risks and benefits of remdesivir were given to the patient and she consented to begin remdesivir protocol.  Remdesivir clinic contacted for continuation of outpatient  doses.  PROCEDURES  Procedures   ED DIAGNOSES     ICD-10-CM   1. Pneumonia due to COVID-19 virus  U07.1    J12.82        Dorethia Jeanmarie, Jenny Reichmann, MD 01/03/21 0430

## 2021-01-03 NOTE — ED Notes (Signed)
Family and Patient verbalizes understanding of discharge instructions. Opportunity for questioning and answers were provided. Armband removed by staff, pt discharged from ED ambulatory to home with family.

## 2021-01-03 NOTE — Discharge Instructions (Addendum)
I have contacted the remdesivir clinic to set up further outpatient infusions with you should expect to hear from them today.

## 2021-01-03 NOTE — Telephone Encounter (Signed)
Patient needing COVID treatment referral.

## 2021-01-04 ENCOUNTER — Other Ambulatory Visit: Payer: Self-pay | Admitting: Family

## 2021-01-04 ENCOUNTER — Ambulatory Visit (HOSPITAL_COMMUNITY)
Admission: RE | Admit: 2021-01-04 | Discharge: 2021-01-04 | Disposition: A | Discharge status: Home / Self Care | Payer: PPO | Source: Ambulatory Visit | Attending: Pulmonary Disease | Admitting: Pulmonary Disease

## 2021-01-04 DIAGNOSIS — J1282 Pneumonia due to coronavirus disease 2019: Secondary | ICD-10-CM | POA: Insufficient documentation

## 2021-01-04 DIAGNOSIS — U071 COVID-19: Secondary | ICD-10-CM | POA: Diagnosis not present

## 2021-01-04 MED ORDER — SODIUM CHLORIDE 0.9 % IV SOLN: INTRAVENOUS | Status: DC | PRN

## 2021-01-04 MED ORDER — METHYLPREDNISOLONE SODIUM SUCC 125 MG IJ SOLR: 125.0000 mg | Freq: Once | INTRAMUSCULAR | Status: DC | PRN

## 2021-01-04 MED ORDER — ALBUTEROL SULFATE HFA 108 (90 BASE) MCG/ACT IN AERS: 2.0000 | INHALATION_SPRAY | Freq: Once | RESPIRATORY_TRACT | Status: DC | PRN

## 2021-01-04 MED ORDER — EPINEPHRINE 0.3 MG/0.3ML IJ SOAJ: 0.3000 mg | Freq: Once | INTRAMUSCULAR | Status: DC | PRN

## 2021-01-04 MED ORDER — FAMOTIDINE IN NACL 20-0.9 MG/50ML-% IV SOLN: 20.0000 mg | Freq: Once | INTRAVENOUS | Status: DC | PRN

## 2021-01-04 MED ORDER — DIPHENHYDRAMINE HCL 50 MG/ML IJ SOLN: 50.0000 mg | Freq: Once | INTRAMUSCULAR | Status: DC | PRN

## 2021-01-04 MED ORDER — SODIUM CHLORIDE 0.9 % IV SOLN
100.0000 mg | Freq: Once | INTRAVENOUS | Status: AC
  Administered 2021-01-04: 100 mg via INTRAVENOUS

## 2021-01-04 NOTE — Discharge Instructions (Signed)
10 Things You Can Do to Manage Your COVID-19 Symptoms at Home °If you have possible or confirmed COVID-19: °1. Stay home except to get medical care. °2. Monitor your symptoms carefully. If your symptoms get worse, call your healthcare provider immediately. °3. Get rest and stay hydrated. °4. If you have a medical appointment, call the healthcare provider ahead of time and tell them that you have or may have COVID-19. °5. For medical emergencies, call 911 and notify the dispatch personnel that you have or may have COVID-19. °6. Cover your cough and sneezes with a tissue or use the inside of your elbow. °7. Wash your hands often with soap and water for at least 20 seconds or clean your hands with an alcohol-based hand sanitizer that contains at least 60% alcohol. °8. As much as possible, stay in a specific room and away from other people in your home. Also, you should use a separate bathroom, if available. If you need to be around other people in or outside of the home, wear a mask. °9. Avoid sharing personal items with other people in your household, like dishes, towels, and bedding. °10. Clean all surfaces that are touched often, like counters, tabletops, and doorknobs. Use household cleaning sprays or wipes according to the label instructions. °cdc.gov/coronavirus °06/17/2020 °This information is not intended to replace advice given to you by your health care provider. Make sure you discuss any questions you have with your health care provider. °Document Revised: 10/03/2020 Document Reviewed: 10/03/2020 °Elsevier Patient Education © 2021 Elsevier Inc[employer] °If you have any questions or concerns after the infusion please call the Advanced Practice Provider on call at [phone]. This number is ONLY intended for your use regarding questions or concerns about the infusion post-treatment side-effects.  Please do not provide this number to others for use. For return to work notes please contact your primary care provider.   ° °If someone you know is interested in receiving treatment please have them contact their MD for a referral or visit [email or web address] ° ° ° °

## 2021-01-04 NOTE — Progress Notes (Addendum)
  Diagnosis: COVID-19  Physician: Dr. Patrick Wright  Procedure: Covid Infusion Clinic Med: remdesivir infusion - Provided patient with remdesivir fact sheet for patients, parents and caregivers prior to infusion.  Complications: No immediate complications noted.  Discharge: Discharged home   Sheryl Schmidt 01/04/2021  

## 2021-01-04 NOTE — Progress Notes (Signed)
Patient reviewed Fact Sheet for Patients, Parents, and Caregivers for Emergency Use Authorization (EUA) of remdesivir for the Treatment of Coronavirus. Patient also reviewed and is agreeable to the estimated cost of treatment. Patient is agreeable to proceed.    

## 2021-01-04 NOTE — Progress Notes (Signed)
Patient received Dose 1 of Remdesivir in ED. Orders placed for doses 2 and 3 in the outpatient setting.   Loel Dubonnet, NP

## 2021-01-05 ENCOUNTER — Ambulatory Visit (HOSPITAL_COMMUNITY)
Admission: RE | Admit: 2021-01-05 | Discharge: 2021-01-05 | Disposition: A | Discharge status: Home / Self Care | Payer: PPO | Source: Ambulatory Visit | Attending: Pulmonary Disease | Admitting: Pulmonary Disease

## 2021-01-05 DIAGNOSIS — J1289 Other viral pneumonia: Secondary | ICD-10-CM | POA: Diagnosis not present

## 2021-01-05 DIAGNOSIS — U071 COVID-19: Secondary | ICD-10-CM | POA: Insufficient documentation

## 2021-01-05 MED ORDER — EPINEPHRINE 0.3 MG/0.3ML IJ SOAJ: 0.3000 mg | Freq: Once | INTRAMUSCULAR | Status: DC | PRN

## 2021-01-05 MED ORDER — SODIUM CHLORIDE 0.9 % IV SOLN: INTRAVENOUS | Status: DC | PRN

## 2021-01-05 MED ORDER — DIPHENHYDRAMINE HCL 50 MG/ML IJ SOLN: 50.0000 mg | Freq: Once | INTRAMUSCULAR | Status: DC | PRN

## 2021-01-05 MED ORDER — METHYLPREDNISOLONE SODIUM SUCC 125 MG IJ SOLR: 125.0000 mg | Freq: Once | INTRAMUSCULAR | Status: DC | PRN

## 2021-01-05 MED ORDER — SODIUM CHLORIDE 0.9 % IV SOLN
100.0000 mg | Freq: Once | INTRAVENOUS | Status: AC
  Administered 2021-01-05: 100 mg via INTRAVENOUS

## 2021-01-05 MED ORDER — ALBUTEROL SULFATE HFA 108 (90 BASE) MCG/ACT IN AERS: 2.0000 | INHALATION_SPRAY | Freq: Once | RESPIRATORY_TRACT | Status: DC | PRN

## 2021-01-05 MED ORDER — FAMOTIDINE IN NACL 20-0.9 MG/50ML-% IV SOLN: 20.0000 mg | Freq: Once | INTRAVENOUS | Status: DC | PRN

## 2021-01-05 NOTE — Discharge Instructions (Signed)
10 Things You Can Do to Manage Your COVID-19 Symptoms at Home °If you have possible or confirmed COVID-19: °1. Stay home except to get medical care. °2. Monitor your symptoms carefully. If your symptoms get worse, call your healthcare provider immediately. °3. Get rest and stay hydrated. °4. If you have a medical appointment, call the healthcare provider ahead of time and tell them that you have or may have COVID-19. °5. For medical emergencies, call 911 and notify the dispatch personnel that you have or may have COVID-19. °6. Cover your cough and sneezes with a tissue or use the inside of your elbow. °7. Wash your hands often with soap and water for at least 20 seconds or clean your hands with an alcohol-based hand sanitizer that contains at least 60% alcohol. °8. As much as possible, stay in a specific room and away from other people in your home. Also, you should use a separate bathroom, if available. If you need to be around other people in or outside of the home, wear a mask. °9. Avoid sharing personal items with other people in your household, like dishes, towels, and bedding. °10. Clean all surfaces that are touched often, like counters, tabletops, and doorknobs. Use household cleaning sprays or wipes according to the label instructions. °cdc.gov/coronavirus °06/17/2020 °This information is not intended to replace advice given to you by your health care provider. Make sure you discuss any questions you have with your health care provider. °Document Revised: 10/03/2020 Document Reviewed: 10/03/2020 °Elsevier Patient Education © 2021 Else[employer] Inc[employer] °If you have any questions or concerns after the infusion please call the Advanced Practice Provider on call at [phone]. This number is ONLY intended for your use regarding questions or concerns about the infusion post-treatment side-effects.  Please do not provide this number to others for use. For return to work notes please contact your primary care provider.   ° °If someone you know is interested in receiving treatment please have them contact their MD for a referral or visit [email or web address] ° ° ° °

## 2021-01-05 NOTE — Progress Notes (Signed)
Patient reviewed Fact Sheet for Patients, Parents, and Caregivers for Emergency Use Authorization (EUA) of remdesivir for the Treatment of Coronavirus. Patient also reviewed and is agreeable to the estimated cost of treatment. Patient is agreeable to proceed.    

## 2021-01-05 NOTE — Discharge Summary (Signed)
  Diagnosis: COVID-19  Physician: Wright   Procedure: [facility] Med: remdesivir infusion - Provided patient with remdesivir fact sheet for patients, parents and caregivers prior to infusion.  Complications: No immediate complications noted.  Discharge: Discharged home   Marquin Patino S 01/05/2021  

## 2021-01-10 DIAGNOSIS — U071 COVID-19: Secondary | ICD-10-CM | POA: Diagnosis not present

## 2021-01-13 ENCOUNTER — Ambulatory Visit
Admission: RE | Admit: 2021-01-13 | Discharge: 2021-01-13 | Disposition: A | Discharge status: Home / Self Care | Payer: PPO | Source: Ambulatory Visit | Attending: Obstetrics and Gynecology | Admitting: Obstetrics and Gynecology

## 2021-01-13 ENCOUNTER — Other Ambulatory Visit: Payer: Self-pay

## 2021-01-13 DIAGNOSIS — Z1231 Encounter for screening mammogram for malignant neoplasm of breast: Secondary | ICD-10-CM

## 2021-02-13 DIAGNOSIS — I6529 Occlusion and stenosis of unspecified carotid artery: Secondary | ICD-10-CM | POA: Diagnosis not present

## 2021-02-13 DIAGNOSIS — I10 Essential (primary) hypertension: Secondary | ICD-10-CM | POA: Diagnosis not present

## 2021-03-06 ENCOUNTER — Ambulatory Visit: Payer: PPO

## 2021-03-13 DIAGNOSIS — I10 Essential (primary) hypertension: Secondary | ICD-10-CM | POA: Diagnosis not present

## 2021-03-13 DIAGNOSIS — I6529 Occlusion and stenosis of unspecified carotid artery: Secondary | ICD-10-CM | POA: Diagnosis not present

## 2021-03-13 DIAGNOSIS — R079 Chest pain, unspecified: Secondary | ICD-10-CM | POA: Diagnosis not present

## 2021-03-29 ENCOUNTER — Other Ambulatory Visit: Payer: Self-pay

## 2021-03-29 ENCOUNTER — Ambulatory Visit
Admission: RE | Admit: 2021-03-29 | Discharge: 2021-03-29 | Disposition: A | Discharge status: Home / Self Care | Payer: PPO | Source: Ambulatory Visit | Attending: Obstetrics and Gynecology | Admitting: Obstetrics and Gynecology

## 2021-03-29 DIAGNOSIS — Z1231 Encounter for screening mammogram for malignant neoplasm of breast: Secondary | ICD-10-CM | POA: Diagnosis not present

## 2021-03-30 DIAGNOSIS — H40013 Open angle with borderline findings, low risk, bilateral: Secondary | ICD-10-CM | POA: Diagnosis not present

## 2021-03-30 DIAGNOSIS — H04123 Dry eye syndrome of bilateral lacrimal glands: Secondary | ICD-10-CM | POA: Diagnosis not present

## 2021-03-30 DIAGNOSIS — H5203 Hypermetropia, bilateral: Secondary | ICD-10-CM | POA: Diagnosis not present

## 2021-03-30 DIAGNOSIS — H2513 Age-related nuclear cataract, bilateral: Secondary | ICD-10-CM | POA: Diagnosis not present

## 2021-05-08 DIAGNOSIS — H6123 Impacted cerumen, bilateral: Secondary | ICD-10-CM | POA: Diagnosis not present

## 2021-05-08 DIAGNOSIS — H6121 Impacted cerumen, right ear: Secondary | ICD-10-CM | POA: Diagnosis not present

## 2021-05-08 DIAGNOSIS — K219 Gastro-esophageal reflux disease without esophagitis: Secondary | ICD-10-CM | POA: Diagnosis not present

## 2021-05-08 DIAGNOSIS — H6122 Impacted cerumen, left ear: Secondary | ICD-10-CM | POA: Diagnosis not present

## 2021-05-24 ENCOUNTER — Ambulatory Visit: Payer: PPO | Admitting: Allergy and Immunology

## 2021-07-26 DIAGNOSIS — E7800 Pure hypercholesterolemia, unspecified: Secondary | ICD-10-CM | POA: Diagnosis not present

## 2021-07-26 DIAGNOSIS — I10 Essential (primary) hypertension: Secondary | ICD-10-CM | POA: Diagnosis not present

## 2021-07-26 DIAGNOSIS — Z1389 Encounter for screening for other disorder: Secondary | ICD-10-CM | POA: Diagnosis not present

## 2021-07-26 DIAGNOSIS — Z0000 Encounter for general adult medical examination without abnormal findings: Secondary | ICD-10-CM | POA: Diagnosis not present

## 2021-08-21 DIAGNOSIS — D225 Melanocytic nevi of trunk: Secondary | ICD-10-CM | POA: Diagnosis not present

## 2021-08-21 DIAGNOSIS — L821 Other seborrheic keratosis: Secondary | ICD-10-CM | POA: Diagnosis not present

## 2021-08-21 DIAGNOSIS — Z85828 Personal history of other malignant neoplasm of skin: Secondary | ICD-10-CM | POA: Diagnosis not present

## 2021-08-21 DIAGNOSIS — L814 Other melanin hyperpigmentation: Secondary | ICD-10-CM | POA: Diagnosis not present

## 2021-08-24 DIAGNOSIS — M8580 Other specified disorders of bone density and structure, unspecified site: Secondary | ICD-10-CM | POA: Diagnosis not present

## 2021-08-24 DIAGNOSIS — N952 Postmenopausal atrophic vaginitis: Secondary | ICD-10-CM | POA: Diagnosis not present

## 2021-08-24 DIAGNOSIS — R87615 Unsatisfactory cytologic smear of cervix: Secondary | ICD-10-CM | POA: Diagnosis not present

## 2021-08-24 DIAGNOSIS — Z01419 Encounter for gynecological examination (general) (routine) without abnormal findings: Secondary | ICD-10-CM | POA: Diagnosis not present

## 2021-09-29 DIAGNOSIS — M25552 Pain in left hip: Secondary | ICD-10-CM | POA: Diagnosis not present

## 2021-09-29 DIAGNOSIS — M545 Low back pain, unspecified: Secondary | ICD-10-CM | POA: Diagnosis not present

## 2021-10-05 DIAGNOSIS — J988 Other specified respiratory disorders: Secondary | ICD-10-CM | POA: Diagnosis not present

## 2021-10-05 DIAGNOSIS — R519 Headache, unspecified: Secondary | ICD-10-CM | POA: Diagnosis not present

## 2021-10-05 DIAGNOSIS — Z03818 Encounter for observation for suspected exposure to other biological agents ruled out: Secondary | ICD-10-CM | POA: Diagnosis not present

## 2021-10-06 DIAGNOSIS — M545 Low back pain, unspecified: Secondary | ICD-10-CM | POA: Diagnosis not present

## 2021-10-09 DIAGNOSIS — M48061 Spinal stenosis, lumbar region without neurogenic claudication: Secondary | ICD-10-CM | POA: Diagnosis not present

## 2021-10-13 DIAGNOSIS — I10 Essential (primary) hypertension: Secondary | ICD-10-CM | POA: Diagnosis not present

## 2021-10-13 DIAGNOSIS — Z6830 Body mass index (BMI) 30.0-30.9, adult: Secondary | ICD-10-CM | POA: Diagnosis not present

## 2021-10-13 DIAGNOSIS — M48062 Spinal stenosis, lumbar region with neurogenic claudication: Secondary | ICD-10-CM | POA: Diagnosis not present

## 2021-10-13 DIAGNOSIS — M25552 Pain in left hip: Secondary | ICD-10-CM | POA: Diagnosis not present

## 2021-11-14 DIAGNOSIS — R87615 Unsatisfactory cytologic smear of cervix: Secondary | ICD-10-CM | POA: Diagnosis not present

## 2021-11-16 ENCOUNTER — Ambulatory Visit (INDEPENDENT_AMBULATORY_CARE_PROVIDER_SITE_OTHER): Payer: PPO

## 2021-11-16 ENCOUNTER — Other Ambulatory Visit: Payer: Self-pay

## 2021-11-16 ENCOUNTER — Encounter: Payer: Self-pay | Admitting: Podiatry

## 2021-11-16 ENCOUNTER — Ambulatory Visit: Payer: PPO | Admitting: Podiatry

## 2021-11-16 DIAGNOSIS — M2042 Other hammer toe(s) (acquired), left foot: Secondary | ICD-10-CM

## 2021-11-16 DIAGNOSIS — M2041 Other hammer toe(s) (acquired), right foot: Secondary | ICD-10-CM

## 2021-11-16 DIAGNOSIS — L84 Corns and callosities: Secondary | ICD-10-CM | POA: Diagnosis not present

## 2021-11-16 NOTE — Progress Notes (Signed)
Subjective:   Patient ID: Sheryl Schmidt, female   DOB: 70 y.o.   MRN: [id number]   HPI Patient presents with a painful corn between the hallux and second toe right and digital deformities of both feet that are bothersome for her.  Patient states she is at hammertoes and problems with these for a long time.  Patient does not smoke likes to be active and has had bunions for as long as she can remember   Review of Systems  All other systems reviewed and are negative.      Objective:  Physical Exam Vitals and nursing note reviewed.  Constitutional:      Appearance: She is well-developed.  Pulmonary:     Effort: Pulmonary effort is normal.  Musculoskeletal:        General: Normal range of motion.  Skin:    General: Skin is warm.  Neurological:     Mental Status: She is alert.    Neurovascular status intact muscle strength found to be adequate range of motion adequate with patient found to have severe digital deformities with rigid contracture digit to right over left with significant structural bunion deformity and keratotic lesion right second toe painful pressing against the right hallux creating irritation between the 2 toes.  Good digital perfusion well oriented     Assessment:  Moderate structural HAV hammertoe deformity bilateral with lesion formation right second toe     Plan:  H&P reviewed all conditions and x-rays and at this point debridement accomplished padding of discussed and applied and discussed possibility for digital stabilization procedure right left with possibility at 1 point in future for structural bunion correction if necessary.  Patient will be seen back to review check and is encouraged to call with questions  X-rays indicate significant structural bunion deformity bilateral with rigid contracture digit to bilateral with pressure between the hallux and second toe bilateral

## 2021-12-09 DIAGNOSIS — M25552 Pain in left hip: Secondary | ICD-10-CM | POA: Diagnosis not present

## 2021-12-14 DIAGNOSIS — M25552 Pain in left hip: Secondary | ICD-10-CM | POA: Diagnosis not present

## 2021-12-20 DIAGNOSIS — M1612 Unilateral primary osteoarthritis, left hip: Secondary | ICD-10-CM | POA: Diagnosis not present

## 2021-12-26 DIAGNOSIS — D2262 Melanocytic nevi of left upper limb, including shoulder: Secondary | ICD-10-CM | POA: Diagnosis not present

## 2021-12-26 DIAGNOSIS — L570 Actinic keratosis: Secondary | ICD-10-CM | POA: Diagnosis not present

## 2021-12-26 DIAGNOSIS — L821 Other seborrheic keratosis: Secondary | ICD-10-CM | POA: Diagnosis not present

## 2021-12-26 DIAGNOSIS — D2272 Melanocytic nevi of left lower limb, including hip: Secondary | ICD-10-CM | POA: Diagnosis not present

## 2021-12-26 DIAGNOSIS — Z85828 Personal history of other malignant neoplasm of skin: Secondary | ICD-10-CM | POA: Diagnosis not present

## 2021-12-26 DIAGNOSIS — L814 Other melanin hyperpigmentation: Secondary | ICD-10-CM | POA: Diagnosis not present

## 2022-01-01 DIAGNOSIS — Z01818 Encounter for other preprocedural examination: Secondary | ICD-10-CM | POA: Diagnosis not present

## 2022-01-15 DIAGNOSIS — M1612 Unilateral primary osteoarthritis, left hip: Secondary | ICD-10-CM | POA: Diagnosis not present

## 2022-01-18 DIAGNOSIS — M6281 Muscle weakness (generalized): Secondary | ICD-10-CM | POA: Diagnosis not present

## 2022-01-18 DIAGNOSIS — M25552 Pain in left hip: Secondary | ICD-10-CM | POA: Diagnosis not present

## 2022-01-18 DIAGNOSIS — M25652 Stiffness of left hip, not elsewhere classified: Secondary | ICD-10-CM | POA: Diagnosis not present

## 2022-01-25 DIAGNOSIS — Z96642 Presence of left artificial hip joint: Secondary | ICD-10-CM | POA: Diagnosis not present

## 2022-01-25 DIAGNOSIS — M1612 Unilateral primary osteoarthritis, left hip: Secondary | ICD-10-CM | POA: Diagnosis not present

## 2022-01-25 HISTORY — PX: TOTAL HIP ARTHROPLASTY: SHX124

## 2022-01-29 DIAGNOSIS — M6281 Muscle weakness (generalized): Secondary | ICD-10-CM | POA: Diagnosis not present

## 2022-01-29 DIAGNOSIS — M25552 Pain in left hip: Secondary | ICD-10-CM | POA: Diagnosis not present

## 2022-01-29 DIAGNOSIS — M25652 Stiffness of left hip, not elsewhere classified: Secondary | ICD-10-CM | POA: Diagnosis not present

## 2022-02-05 DIAGNOSIS — M25552 Pain in left hip: Secondary | ICD-10-CM | POA: Diagnosis not present

## 2022-02-05 DIAGNOSIS — Z9889 Other specified postprocedural states: Secondary | ICD-10-CM | POA: Diagnosis not present

## 2022-02-08 DIAGNOSIS — M25552 Pain in left hip: Secondary | ICD-10-CM | POA: Diagnosis not present

## 2022-02-08 DIAGNOSIS — M25652 Stiffness of left hip, not elsewhere classified: Secondary | ICD-10-CM | POA: Diagnosis not present

## 2022-02-08 DIAGNOSIS — M6281 Muscle weakness (generalized): Secondary | ICD-10-CM | POA: Diagnosis not present

## 2022-02-13 DIAGNOSIS — M25552 Pain in left hip: Secondary | ICD-10-CM | POA: Diagnosis not present

## 2022-02-13 DIAGNOSIS — M25652 Stiffness of left hip, not elsewhere classified: Secondary | ICD-10-CM | POA: Diagnosis not present

## 2022-02-13 DIAGNOSIS — M6281 Muscle weakness (generalized): Secondary | ICD-10-CM | POA: Diagnosis not present

## 2022-02-16 DIAGNOSIS — M6281 Muscle weakness (generalized): Secondary | ICD-10-CM | POA: Diagnosis not present

## 2022-02-16 DIAGNOSIS — M25552 Pain in left hip: Secondary | ICD-10-CM | POA: Diagnosis not present

## 2022-02-16 DIAGNOSIS — M25652 Stiffness of left hip, not elsewhere classified: Secondary | ICD-10-CM | POA: Diagnosis not present

## 2022-02-20 DIAGNOSIS — M25552 Pain in left hip: Secondary | ICD-10-CM | POA: Diagnosis not present

## 2022-02-20 DIAGNOSIS — M6281 Muscle weakness (generalized): Secondary | ICD-10-CM | POA: Diagnosis not present

## 2022-02-20 DIAGNOSIS — M25652 Stiffness of left hip, not elsewhere classified: Secondary | ICD-10-CM | POA: Diagnosis not present

## 2022-02-22 DIAGNOSIS — M25652 Stiffness of left hip, not elsewhere classified: Secondary | ICD-10-CM | POA: Diagnosis not present

## 2022-02-22 DIAGNOSIS — M6281 Muscle weakness (generalized): Secondary | ICD-10-CM | POA: Diagnosis not present

## 2022-02-22 DIAGNOSIS — M25552 Pain in left hip: Secondary | ICD-10-CM | POA: Diagnosis not present

## 2022-02-27 DIAGNOSIS — M25552 Pain in left hip: Secondary | ICD-10-CM | POA: Diagnosis not present

## 2022-02-27 DIAGNOSIS — M6281 Muscle weakness (generalized): Secondary | ICD-10-CM | POA: Diagnosis not present

## 2022-02-27 DIAGNOSIS — M25652 Stiffness of left hip, not elsewhere classified: Secondary | ICD-10-CM | POA: Diagnosis not present

## 2022-02-28 DIAGNOSIS — M25652 Stiffness of left hip, not elsewhere classified: Secondary | ICD-10-CM | POA: Diagnosis not present

## 2022-02-28 DIAGNOSIS — M6281 Muscle weakness (generalized): Secondary | ICD-10-CM | POA: Diagnosis not present

## 2022-02-28 DIAGNOSIS — M25552 Pain in left hip: Secondary | ICD-10-CM | POA: Diagnosis not present

## 2022-03-06 DIAGNOSIS — M25652 Stiffness of left hip, not elsewhere classified: Secondary | ICD-10-CM | POA: Diagnosis not present

## 2022-03-06 DIAGNOSIS — M6281 Muscle weakness (generalized): Secondary | ICD-10-CM | POA: Diagnosis not present

## 2022-03-06 DIAGNOSIS — M25552 Pain in left hip: Secondary | ICD-10-CM | POA: Diagnosis not present

## 2022-03-08 DIAGNOSIS — M6281 Muscle weakness (generalized): Secondary | ICD-10-CM | POA: Diagnosis not present

## 2022-03-08 DIAGNOSIS — M25552 Pain in left hip: Secondary | ICD-10-CM | POA: Diagnosis not present

## 2022-03-08 DIAGNOSIS — M25652 Stiffness of left hip, not elsewhere classified: Secondary | ICD-10-CM | POA: Diagnosis not present

## 2022-03-15 DIAGNOSIS — M6281 Muscle weakness (generalized): Secondary | ICD-10-CM | POA: Diagnosis not present

## 2022-03-15 DIAGNOSIS — M25652 Stiffness of left hip, not elsewhere classified: Secondary | ICD-10-CM | POA: Diagnosis not present

## 2022-03-15 DIAGNOSIS — M25552 Pain in left hip: Secondary | ICD-10-CM | POA: Diagnosis not present

## 2022-03-16 DIAGNOSIS — M25552 Pain in left hip: Secondary | ICD-10-CM | POA: Diagnosis not present

## 2022-03-16 DIAGNOSIS — M25652 Stiffness of left hip, not elsewhere classified: Secondary | ICD-10-CM | POA: Diagnosis not present

## 2022-03-16 DIAGNOSIS — M6281 Muscle weakness (generalized): Secondary | ICD-10-CM | POA: Diagnosis not present

## 2022-03-20 DIAGNOSIS — M6281 Muscle weakness (generalized): Secondary | ICD-10-CM | POA: Diagnosis not present

## 2022-03-20 DIAGNOSIS — M25652 Stiffness of left hip, not elsewhere classified: Secondary | ICD-10-CM | POA: Diagnosis not present

## 2022-03-20 DIAGNOSIS — M25552 Pain in left hip: Secondary | ICD-10-CM | POA: Diagnosis not present

## 2022-04-05 DIAGNOSIS — H40013 Open angle with borderline findings, low risk, bilateral: Secondary | ICD-10-CM | POA: Diagnosis not present

## 2022-04-05 DIAGNOSIS — H2513 Age-related nuclear cataract, bilateral: Secondary | ICD-10-CM | POA: Diagnosis not present

## 2022-04-05 DIAGNOSIS — H524 Presbyopia: Secondary | ICD-10-CM | POA: Diagnosis not present

## 2022-04-09 DIAGNOSIS — M25552 Pain in left hip: Secondary | ICD-10-CM | POA: Diagnosis not present

## 2022-04-09 DIAGNOSIS — M25652 Stiffness of left hip, not elsewhere classified: Secondary | ICD-10-CM | POA: Diagnosis not present

## 2022-04-09 DIAGNOSIS — M6281 Muscle weakness (generalized): Secondary | ICD-10-CM | POA: Diagnosis not present

## 2022-04-12 DIAGNOSIS — M25652 Stiffness of left hip, not elsewhere classified: Secondary | ICD-10-CM | POA: Diagnosis not present

## 2022-04-12 DIAGNOSIS — M6281 Muscle weakness (generalized): Secondary | ICD-10-CM | POA: Diagnosis not present

## 2022-04-12 DIAGNOSIS — M25552 Pain in left hip: Secondary | ICD-10-CM | POA: Diagnosis not present

## 2022-04-18 DIAGNOSIS — M25652 Stiffness of left hip, not elsewhere classified: Secondary | ICD-10-CM | POA: Diagnosis not present

## 2022-04-18 DIAGNOSIS — M25552 Pain in left hip: Secondary | ICD-10-CM | POA: Diagnosis not present

## 2022-04-18 DIAGNOSIS — M6281 Muscle weakness (generalized): Secondary | ICD-10-CM | POA: Diagnosis not present

## 2022-04-20 DIAGNOSIS — Z96642 Presence of left artificial hip joint: Secondary | ICD-10-CM | POA: Diagnosis not present

## 2022-04-26 DIAGNOSIS — M25552 Pain in left hip: Secondary | ICD-10-CM | POA: Diagnosis not present

## 2022-04-26 DIAGNOSIS — M6281 Muscle weakness (generalized): Secondary | ICD-10-CM | POA: Diagnosis not present

## 2022-04-26 DIAGNOSIS — M25652 Stiffness of left hip, not elsewhere classified: Secondary | ICD-10-CM | POA: Diagnosis not present

## 2022-04-30 IMAGING — DX DG CHEST 1V PORT
1 series · 1 of 1 positions shown · non-contrast
Comparison: 02/17/2014

CLINICAL DATA: 69-year-old female with cough

EXAM:
PORTABLE CHEST 1 VIEW

[chest ap]
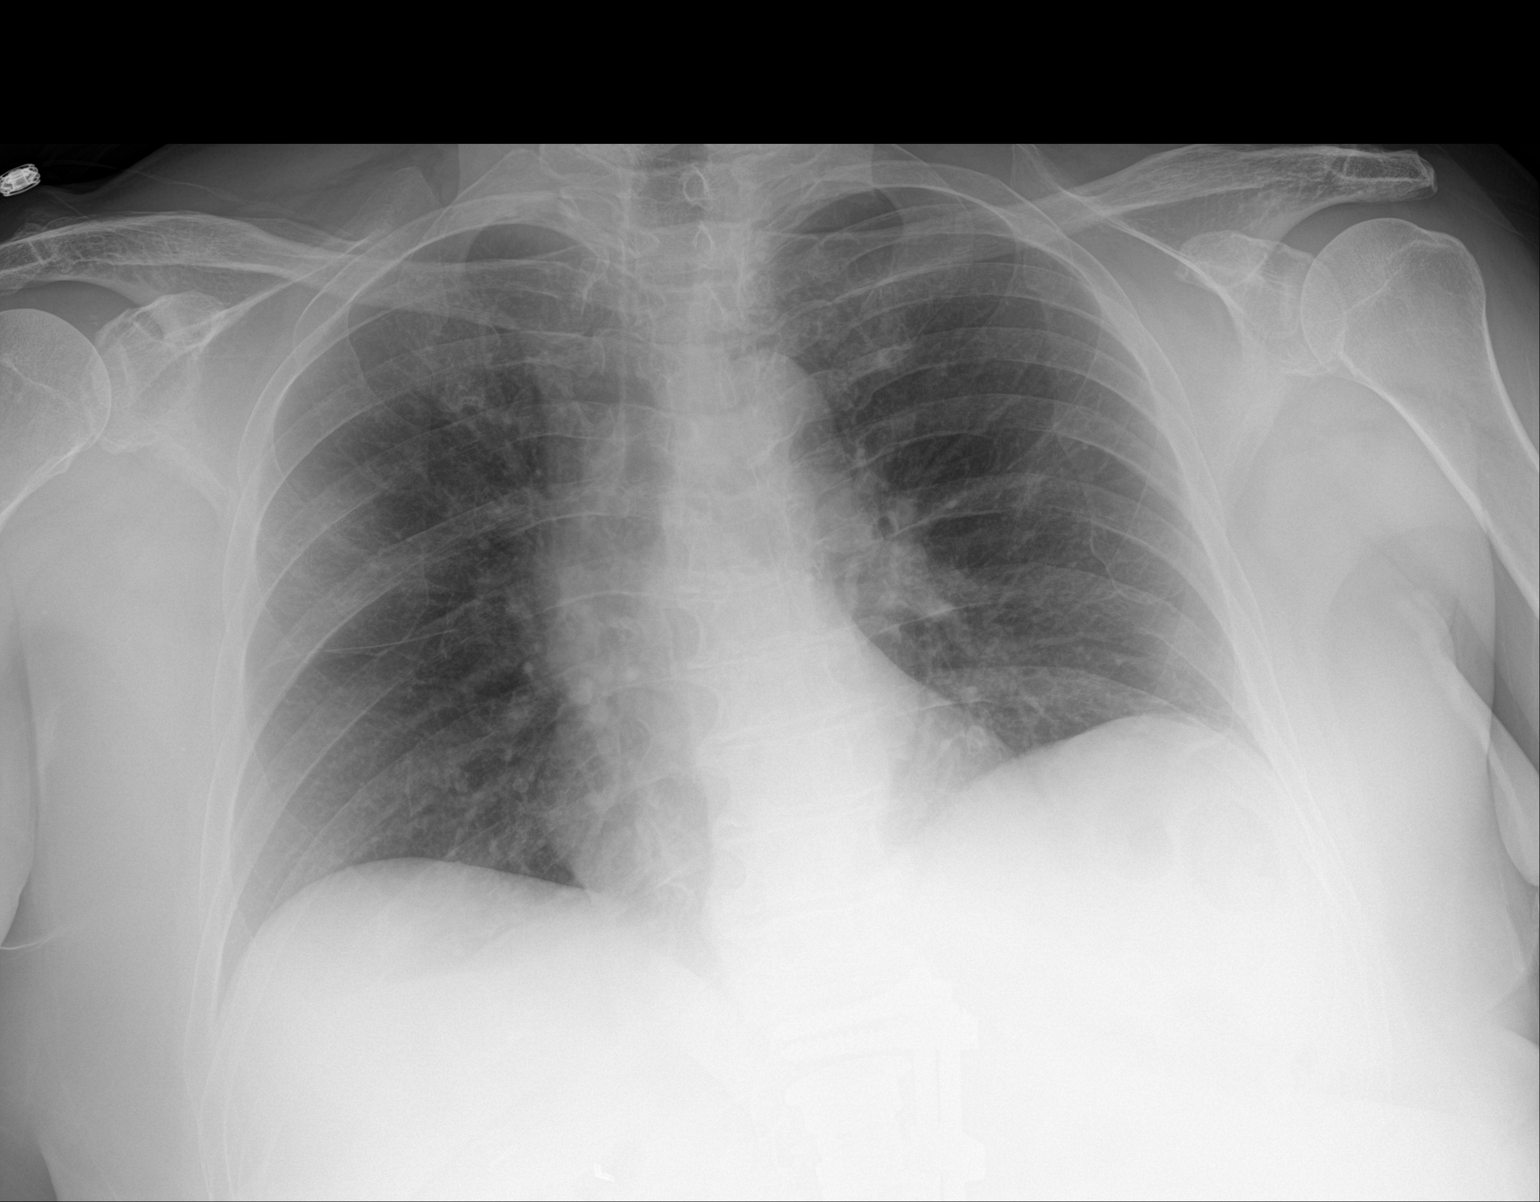

[1 of 1 positions shown; findings below may reference images not displayed]

FINDINGS: Cardiomediastinal silhouette unchanged in size and contour. Low lung
volumes. No pneumothorax or pleural effusion. No confluent airspace
disease. Mildly coarsened interstitial markings, similar to the
prior.

Incompletely imaged surgical changes of the lower thoracic/lumbar
spine.
IMPRESSION: Negative for acute cardiopulmonary disease

## 2022-05-02 DIAGNOSIS — M6281 Muscle weakness (generalized): Secondary | ICD-10-CM | POA: Diagnosis not present

## 2022-05-02 DIAGNOSIS — M25652 Stiffness of left hip, not elsewhere classified: Secondary | ICD-10-CM | POA: Diagnosis not present

## 2022-05-02 DIAGNOSIS — M25552 Pain in left hip: Secondary | ICD-10-CM | POA: Diagnosis not present

## 2022-05-02 IMAGING — DX DG CHEST 1V PORT
1 series · 1 of 1 positions shown · non-contrast
Comparison: 12/31/2020

CLINICAL DATA: Hypoxia and P9EA8-8I positivity

EXAM:
PORTABLE CHEST 1 VIEW

[chest ap]
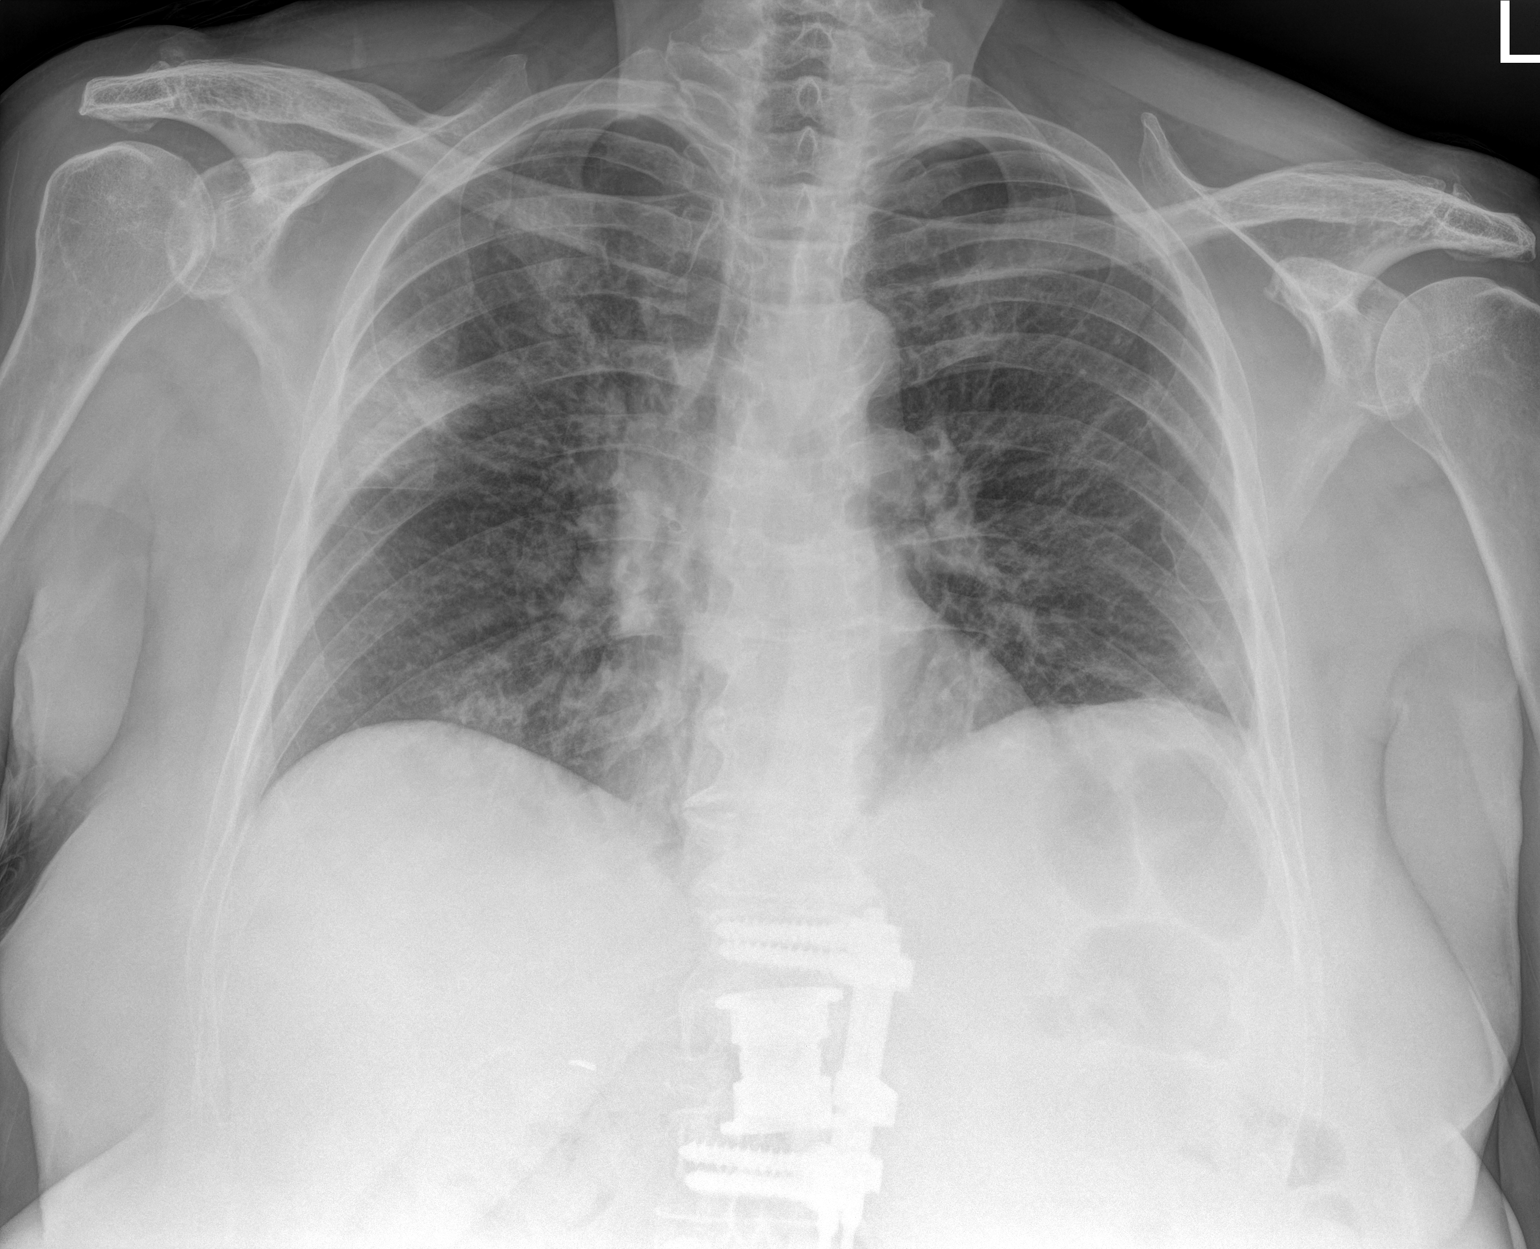

[1 of 1 positions shown; findings below may reference images not displayed]

FINDINGS: Cardiac shadow is within normal limits. Lungs are well aerated
bilaterally. Increased patchy airspace opacities noted slightly
worse on the right shows a left consistent with the given clinical
history of P9EA8-8I positivity. Postsurgical changes in the
thoracolumbar junction are noted.
IMPRESSION: Patchy airspace opacities consistent with the given clinical
history.

## 2022-05-09 DIAGNOSIS — M25552 Pain in left hip: Secondary | ICD-10-CM | POA: Diagnosis not present

## 2022-05-09 DIAGNOSIS — M25652 Stiffness of left hip, not elsewhere classified: Secondary | ICD-10-CM | POA: Diagnosis not present

## 2022-05-09 DIAGNOSIS — M6281 Muscle weakness (generalized): Secondary | ICD-10-CM | POA: Diagnosis not present

## 2022-05-15 ENCOUNTER — Encounter: Payer: Self-pay | Admitting: Gastroenterology

## 2022-05-16 DIAGNOSIS — M25552 Pain in left hip: Secondary | ICD-10-CM | POA: Diagnosis not present

## 2022-05-16 DIAGNOSIS — M25652 Stiffness of left hip, not elsewhere classified: Secondary | ICD-10-CM | POA: Diagnosis not present

## 2022-05-16 DIAGNOSIS — M6281 Muscle weakness (generalized): Secondary | ICD-10-CM | POA: Diagnosis not present

## 2022-05-30 DIAGNOSIS — H6123 Impacted cerumen, bilateral: Secondary | ICD-10-CM | POA: Diagnosis not present

## 2022-06-01 DIAGNOSIS — Z1231 Encounter for screening mammogram for malignant neoplasm of breast: Secondary | ICD-10-CM | POA: Diagnosis not present

## 2022-06-18 ENCOUNTER — Ambulatory Visit (AMBULATORY_SURGERY_CENTER): Payer: Self-pay | Admitting: *Deleted

## 2022-06-18 VITALS — Ht 61.5 in | Wt 164.0 lb

## 2022-06-18 DIAGNOSIS — Z86010 Personal history of colonic polyps: Secondary | ICD-10-CM

## 2022-06-18 MED ORDER — NA SULFATE-K SULFATE-MG SULF 17.5-3.13-1.6 GM/177ML PO SOLN: 1.0000 | ORAL | 0 refills | Status: DC

## 2022-06-18 NOTE — Progress Notes (Signed)
Patient is here in-person for PV. Patient denies any allergies to eggs or soy. Patient denies any problems with anesthesia/sedation. Patient is not on any oxygen at home. Patient is not taking any diet/weight loss medications or blood thinners. Went over procedure prep instructions with the patient. Patient is aware of our care-partner policy. Patient notified to use Good-Rx for prescription.    

## 2022-07-11 ENCOUNTER — Encounter: Payer: Self-pay | Admitting: Gastroenterology

## 2022-07-17 ENCOUNTER — Encounter: Payer: Self-pay | Admitting: Gastroenterology

## 2022-07-17 ENCOUNTER — Ambulatory Visit (AMBULATORY_SURGERY_CENTER): Payer: PPO | Admitting: Gastroenterology

## 2022-07-17 VITALS — BP 113/67 | HR 63 | Temp 97.8°F | Resp 14 | Ht 61.5 in | Wt 164.0 lb

## 2022-07-17 DIAGNOSIS — Z86010 Personal history of colonic polyps: Secondary | ICD-10-CM

## 2022-07-17 DIAGNOSIS — K635 Polyp of colon: Secondary | ICD-10-CM

## 2022-07-17 DIAGNOSIS — Z09 Encounter for follow-up examination after completed treatment for conditions other than malignant neoplasm: Secondary | ICD-10-CM

## 2022-07-17 DIAGNOSIS — I10 Essential (primary) hypertension: Secondary | ICD-10-CM | POA: Diagnosis not present

## 2022-07-17 DIAGNOSIS — D123 Benign neoplasm of transverse colon: Secondary | ICD-10-CM

## 2022-07-17 DIAGNOSIS — D120 Benign neoplasm of cecum: Secondary | ICD-10-CM | POA: Diagnosis not present

## 2022-07-17 MED ORDER — SODIUM CHLORIDE 0.9 % IV SOLN: 500.0000 mL | INTRAVENOUS | Status: DC

## 2022-07-17 NOTE — Patient Instructions (Signed)
Read all of the handouts given to you by your recovery room nurse.  YOU HAD AN ENDOSCOPIC PROCEDURE TODAY AT THE [facility]:   Refer to the procedure report that was given to you for any specific questions about what was found during the examination.  If the procedure report does not answer your questions, please call your gastroenterologist to clarify.  If you requested that your care partner not be given the details of your procedure findings, then the procedure report has been included in a sealed envelope for you to review at your convenience later.  YOU SHOULD EXPECT: Some feelings of bloating in the abdomen. Passage of more gas than usual.  Walking can help get rid of the air that was put into your GI tract during the procedure and reduce the bloating. If you had a lower endoscopy (such as a colonoscopy or flexible sigmoidoscopy) you may notice spotting of blood in your stool or on the toilet paper. If you underwent a bowel prep for your procedure, you may not have a normal bowel movement for a few days.  Please Note:  You might notice some irritation and congestion in your nose or some drainage.  This is from the oxygen used during your procedure.  There is no need for concern and it should clear up in a day or so.  SYMPTOMS TO REPORT IMMEDIATELY:  Following lower endoscopy (colonoscopy or flexible sigmoidoscopy):  Excessive amounts of blood in the stool  Significant tenderness or worsening of abdominal pains  Swelling of the abdomen that is new, acute  Fever of 100F or higher   For urgent or emergent issues, a gastroenterologist can be reached at any hour by calling [phone]. Do not use MyChart messaging for urgent concerns.    DIET:  We do recommend a small meal at first, but then you may proceed to your regular diet.  Drink plenty of fluids but you should avoid alcoholic beverages for 24 hours.  ACTIVITY:  You should plan to take it easy for the rest of today and  you should NOT DRIVE or use heavy machinery until tomorrow (because of the sedation medicines used during the test).    FOLLOW UP: Our staff will call the number listed on your records the next business day following your procedure.  We will call around 7:15- 8:00 am to check on you and address any questions or concerns that you may have regarding the information given to you following your procedure. If we do not reach you, we will leave a message.  If you develop any symptoms (ie: fever, flu-like symptoms, shortness of breath, cough etc.) before then, please call [phone].  If you test positive for Covid 19 in the 2 weeks post procedure, please call and report this information to us.    If any biopsies were taken you will be contacted by phone or by letter within the next 1-3 weeks.  Please call us at [phone] if you have not heard about the biopsies in 3 weeks.    SIGNATURES/CONFIDENTIALITY: You and/or your care partner have signed paperwork which will be entered into your electronic medical record.  These signatures attest to the fact that that the information above on your After Visit Summary has been reviewed and is understood.  Full responsibility of the confidentiality of this discharge information lies with you and/or your care-partner.  

## 2022-07-17 NOTE — Progress Notes (Signed)
Report to PACU, RN, vss, BBS= Clear.  

## 2022-07-17 NOTE — Progress Notes (Signed)
[facility] History and Physical   Primary Care Physician:  Lavone Orn, MD   Reason for Procedure:   History of colon polyps  Plan:    colonoscopy     HPI: Sheryl Schmidt is a 71 y.o. female  here for colonoscopy surveillance - history of 4 adenomas removed 05/2019. Patient denies any bowel symptoms at this time. No family history of colon cancer known. Otherwise feels well without any cardiopulmonary symptoms.   I have discussed risks / benefits of anesthesia and endoscopic procedure with Renato Battles and they wish to proceed with the exams as outlined today.    Past Medical History:  Diagnosis Date   Allergy    Cholelithiasis    Hypertension    Internal hemorrhoid     Past Surgical History:  Procedure Laterality Date   BACK SURGERY  2011   removed L1 and removed a rib   CESAREAN SECTION     CHOLECYSTECTOMY N/A 02/18/2014   Procedure: LAPAROSCOPIC CHOLECYSTECTOMY WITH INTRAOPERATIVE CHOLANGIOGRAM;  Surgeon: Odis Hollingshead, MD;  Location: [facility];  Service: General;  Laterality: N/A;   COLONOSCOPY  05/18/2019   Dr.Marzella Miracle   TOTAL HIP ARTHROPLASTY Left 01/25/2022    Prior to Admission medications   Medication Sig Start Date End Date Taking? Authorizing Provider  amLODipine (NORVASC) 5 MG tablet Take 5 mg by mouth daily. 08/17/20  Yes [provider]  Ascorbic Acid (VITAMIN C) 1000 MG tablet Take 1,000 mg by mouth in the morning and at bedtime.    Yes [provider]  aspirin 81 MG chewable tablet Chew 81 mg by mouth daily.   Yes [provider]  Coenzyme Q10 (COQ10) 200 MG CAPS Take 1 capsule by mouth in the morning and at bedtime.    Yes [provider]  Flaxseed, Linseed, (FLAX SEED OIL PO) Take by mouth.   Yes [provider]  Garlic 6203 MG CAPS Take 1 capsule by mouth daily.   Yes [provider]  Astrid Drafts 500 MG CAPS Take 1 capsule by mouth in the morning and at bedtime.   Yes  [provider]  MAGNESIUM PO Take 250 mg by mouth daily.   Yes [provider]  Na Sulfate-K Sulfate-Mg Sulf 17.5-3.13-1.6 GM/177ML SOLN Take 1 kit by mouth as directed. May use generic SUPREP;NO prior authorizations will be done.Please use Singlecare or GOOD-RX coupon. 06/18/22  Yes Carmon Sahli, Carlota Raspberry, MD  TURMERIC CURCUMIN PO Take 900 mg by mouth daily.    Yes [provider]  VITAMIN D PO Take by mouth in the morning and at bedtime.   Yes [provider]  ondansetron (ZOFRAN ODT) 4 MG disintegrating tablet Take 1 tablet (4 mg total) by mouth every 8 (eight) hours as needed for nausea or vomiting. 12/31/20   Tedd Sias, PA    Current Outpatient Medications  Medication Sig Dispense Refill   amLODipine (NORVASC) 5 MG tablet Take 5 mg by mouth daily.     Ascorbic Acid (VITAMIN C) 1000 MG tablet Take 1,000 mg by mouth in the morning and at bedtime.      aspirin 81 MG chewable tablet Chew 81 mg by mouth daily.     Coenzyme Q10 (COQ10) 200 MG CAPS Take 1 capsule by mouth in the morning and at bedtime.      Flaxseed, Linseed, (FLAX SEED OIL PO) Take by mouth.     Garlic 5597 MG CAPS Take 1 capsule by mouth daily.  Krill Oil 500 MG CAPS Take 1 capsule by mouth in the morning and at bedtime.     MAGNESIUM PO Take 250 mg by mouth daily.     Na Sulfate-K Sulfate-Mg Sulf 17.5-3.13-1.6 GM/177ML SOLN Take 1 kit by mouth as directed. May use generic SUPREP;NO prior authorizations will be done.Please use Singlecare or GOOD-RX coupon. 354 mL 0   TURMERIC CURCUMIN PO Take 900 mg by mouth daily.      VITAMIN D PO Take by mouth in the morning and at bedtime.     ondansetron (ZOFRAN ODT) 4 MG disintegrating tablet Take 1 tablet (4 mg total) by mouth every 8 (eight) hours as needed for nausea or vomiting. 20 tablet 0   Current Facility-Administered Medications  Medication Dose Route Frequency Provider Last Rate Last Admin   0.9 %  sodium chloride infusion  500 mL  Intravenous Continuous Floriene Jeschke, Carlota Raspberry, MD        Allergies as of 07/17/2022 - Review Complete 07/17/2022  Allergen Reaction Noted   Losartan potassium Other (See Comments) 12/28/2020   Other Other (See Comments) 12/28/2020   Sulfonamide derivatives  02/02/2009   Telmisartan Other (See Comments) 12/28/2020    Family History  Problem Relation Age of Onset   COPD Father    Heart disease Father    Colon cancer Maternal Uncle    Asthma Daughter    Diabetes Other    Esophageal cancer Neg Hx    Rectal cancer Neg Hx    Stomach cancer Neg Hx    Colon polyps Neg Hx     Social History   Socioeconomic History   Marital status: Married    Spouse name: Not on file   Number of children: 2   Years of education: [employer] school   Highest education level: Not on file  Occupational History   Occupation: Pharmacist, hospital  Tobacco Use   Smoking status: Never   Smokeless tobacco: Never  Vaping Use   Vaping Use: Never used  Substance and Sexual Activity   Alcohol use: Not Currently    Comment: rarely   Drug use: No   Sexual activity: Not on file  Other Topics Concern   Not on file  Social History Narrative   Lives at home w/ husband   Right-handed   Caffeine: limited   Social Determinants of Health   Financial Resource Strain: Not on file  Food Insecurity: Not on file  Transportation Needs: Not on file  Physical Activity: Not on file  Stress: Not on file  Social Connections: Not on file  Intimate Partner Violence: Not on file    Review of Systems: All other review of systems negative except as mentioned in the HPI.  Physical Exam: Vital signs BP (!) 152/90   Pulse 76   Temp 97.8 F (36.6 C) (Temporal)   Resp 12   Ht 5' 1.5" (1.562 m)   Wt 164 lb (74.4 kg)   SpO2 99%   BMI 30.49 kg/m   General:   Alert,  Well-developed, pleasant and cooperative in NAD Lungs:  Clear throughout to auscultation.   Heart:  Regular rate and rhythm Abdomen:  Soft,  nontender and nondistended.   Neuro/Psych:  Alert and cooperative. Normal mood and affect. A and O x 3  Jolly Mango, MD [facility]

## 2022-07-17 NOTE — Progress Notes (Signed)
Pt's states no medical or surgical changes since previsit or office visit. 

## 2022-07-17 NOTE — Op Note (Signed)
[facility] Patient Name: Sheryl Schmidt Procedure Date: 07/17/2022 7:26 AM MRN: [id number] Endoscopist: Remo Lipps P. Havery Moros , MD Age: 71 Referring MD:  Date of Birth: 02-26-1951 Gender: Female Account #: [id number] Procedure:                Colonoscopy Indications:              High risk colon cancer surveillance: Personal                            history of colonic polyps - 4 adenomas removed                            05/2019 Medicines:                Monitored Anesthesia Care Procedure:                Pre-Anesthesia Assessment:                           - Prior to the procedure, a History and Physical                            was performed, and patient medications and                            allergies were reviewed. The patient's tolerance of                            previous anesthesia was also reviewed. The risks                            and benefits of the procedure and the sedation                            options and risks were discussed with the patient.                            All questions were answered, and informed consent                            was obtained. Prior Anticoagulants: The patient has                            taken no previous anticoagulant or antiplatelet                            agents. ASA Grade Assessment: II - A patient with                            mild systemic disease. After reviewing the risks                            and benefits, the patient was deemed in  satisfactory condition to undergo the procedure.                           After obtaining informed consent, the colonoscope                            was passed under direct vision. Throughout the                            procedure, the patient's blood pressure, pulse, and                            oxygen saturations were monitored continuously. The                            CF [id number] #[id number] was introduced through the anus                             and advanced to the the terminal ileum, with                            identification of the appendiceal orifice and IC                            valve. The colonoscopy was performed without                            difficulty. The patient tolerated the procedure                            well. The quality of the bowel preparation was                            good. The ileocecal valve, appendiceal orifice, and                            rectum were photographed. Scope In: 8:04:27 AM Scope Out: 8:22:41 AM Scope Withdrawal Time: 0 hours 12 minutes 34 seconds  Total Procedure Duration: 0 hours 18 minutes 14 seconds  Findings:                 The perianal and digital rectal examinations were                            normal.                           The terminal ileum appeared normal.                           A diminutive polyp was found in the cecum. The                            polyp was flat. The polyp was removed with a cold  snare. Resection and retrieval were complete.                           A 3 to 4 mm polyp was found in the transverse                            colon. The polyp was flat. The polyp was removed                            with a cold snare. Resection and retrieval were                            complete.                           The colon was tortuous.                           Internal hemorrhoids were found during                            retroflexion. The hemorrhoids were small.                           The exam was otherwise without abnormality. Complications:            No immediate complications. Estimated blood loss:                            Minimal. Estimated Blood Loss:     Estimated blood loss was minimal. Impression:               - The examined portion of the ileum was normal.                           - One diminutive polyp in the cecum, removed with a                             cold snare. Resected and retrieved.                           - One 3 to 4 mm polyp in the transverse colon,                            removed with a cold snare. Resected and retrieved.                           - Tortuous colon.                           - Internal hemorrhoids.                           - The examination was otherwise normal. Recommendation:           - Patient has a contact number available for  emergencies. The signs and symptoms of potential                            delayed complications were discussed with the                            patient. Return to normal activities tomorrow.                            Written discharge instructions were provided to the                            patient.                           - Resume previous diet.                           - Continue present medications.                           - Await pathology results. Remo Lipps P. Havery Moros, MD 07/17/2022 8:26:24 AM This report has been signed electronically.

## 2022-07-17 NOTE — Progress Notes (Signed)
Called to room to assist during endoscopic procedure.  Patient ID and intended procedure confirmed with present staff. Received instructions for my participation in the procedure from the performing physician.  

## 2022-07-18 ENCOUNTER — Telehealth: Payer: Self-pay

## 2022-07-18 NOTE — Telephone Encounter (Signed)
  Follow up Call-     07/17/2022    7:38 AM  Call back number  Post procedure Call Back phone  # [phone]  Permission to leave phone message Yes     Patient questions:  Do you have a fever, pain , or abdominal swelling? No. Pain Score  0 *  Have you tolerated food without any problems? Yes.    Have you been able to return to your normal activities? Yes.    Do you have any questions about your discharge instructions: Diet   No. Medications  No. Follow up visit  No.  Do you have questions or concerns about your Care? No.  Actions: * If pain score is 4 or above: No action needed, pain <4.

## 2022-07-31 DIAGNOSIS — Z0000 Encounter for general adult medical examination without abnormal findings: Secondary | ICD-10-CM | POA: Diagnosis not present

## 2022-07-31 DIAGNOSIS — E559 Vitamin D deficiency, unspecified: Secondary | ICD-10-CM | POA: Diagnosis not present

## 2022-07-31 DIAGNOSIS — Z789 Other specified health status: Secondary | ICD-10-CM | POA: Diagnosis not present

## 2022-07-31 DIAGNOSIS — I10 Essential (primary) hypertension: Secondary | ICD-10-CM | POA: Diagnosis not present

## 2022-07-31 DIAGNOSIS — E7800 Pure hypercholesterolemia, unspecified: Secondary | ICD-10-CM | POA: Diagnosis not present

## 2022-07-31 DIAGNOSIS — Z8781 Personal history of (healed) traumatic fracture: Secondary | ICD-10-CM | POA: Diagnosis not present

## 2022-08-07 ENCOUNTER — Other Ambulatory Visit: Payer: Self-pay | Admitting: Physician Assistant

## 2022-08-07 DIAGNOSIS — Z8249 Family history of ischemic heart disease and other diseases of the circulatory system: Secondary | ICD-10-CM | POA: Diagnosis not present

## 2022-08-07 DIAGNOSIS — E7800 Pure hypercholesterolemia, unspecified: Secondary | ICD-10-CM | POA: Diagnosis not present

## 2022-08-28 DIAGNOSIS — I10 Essential (primary) hypertension: Secondary | ICD-10-CM | POA: Diagnosis not present

## 2022-08-28 DIAGNOSIS — Z01419 Encounter for gynecological examination (general) (routine) without abnormal findings: Secondary | ICD-10-CM | POA: Diagnosis not present

## 2022-08-28 DIAGNOSIS — M8580 Other specified disorders of bone density and structure, unspecified site: Secondary | ICD-10-CM | POA: Diagnosis not present

## 2022-09-05 DIAGNOSIS — M8589 Other specified disorders of bone density and structure, multiple sites: Secondary | ICD-10-CM | POA: Diagnosis not present

## 2022-09-05 DIAGNOSIS — Z780 Asymptomatic menopausal state: Secondary | ICD-10-CM | POA: Diagnosis not present

## 2022-09-24 DIAGNOSIS — Z96642 Presence of left artificial hip joint: Secondary | ICD-10-CM | POA: Diagnosis not present

## 2022-09-24 DIAGNOSIS — M25552 Pain in left hip: Secondary | ICD-10-CM | POA: Diagnosis not present

## 2022-10-29 ENCOUNTER — Other Ambulatory Visit: Payer: PPO

## 2022-11-05 ENCOUNTER — Other Ambulatory Visit: Payer: PPO

## 2022-11-05 ENCOUNTER — Ambulatory Visit
Admission: RE | Admit: 2022-11-05 | Discharge: 2022-11-05 | Disposition: A | Discharge status: Home / Self Care | Payer: No Typology Code available for payment source | Source: Ambulatory Visit | Attending: Physician Assistant | Admitting: Physician Assistant

## 2022-11-05 DIAGNOSIS — E7800 Pure hypercholesterolemia, unspecified: Secondary | ICD-10-CM

## 2022-11-05 DIAGNOSIS — I700 Atherosclerosis of aorta: Secondary | ICD-10-CM | POA: Diagnosis not present

## 2022-11-05 DIAGNOSIS — Z8249 Family history of ischemic heart disease and other diseases of the circulatory system: Secondary | ICD-10-CM

## 2023-01-15 DIAGNOSIS — L814 Other melanin hyperpigmentation: Secondary | ICD-10-CM | POA: Diagnosis not present

## 2023-01-15 DIAGNOSIS — Z85828 Personal history of other malignant neoplasm of skin: Secondary | ICD-10-CM | POA: Diagnosis not present

## 2023-01-15 DIAGNOSIS — L821 Other seborrheic keratosis: Secondary | ICD-10-CM | POA: Diagnosis not present

## 2023-02-07 DIAGNOSIS — E7800 Pure hypercholesterolemia, unspecified: Secondary | ICD-10-CM | POA: Diagnosis not present

## 2023-02-07 DIAGNOSIS — R931 Abnormal findings on diagnostic imaging of heart and coronary circulation: Secondary | ICD-10-CM | POA: Diagnosis not present

## 2023-02-07 DIAGNOSIS — B9689 Other specified bacterial agents as the cause of diseases classified elsewhere: Secondary | ICD-10-CM | POA: Diagnosis not present

## 2023-02-07 DIAGNOSIS — J208 Acute bronchitis due to other specified organisms: Secondary | ICD-10-CM | POA: Diagnosis not present

## 2023-04-15 DIAGNOSIS — H40013 Open angle with borderline findings, low risk, bilateral: Secondary | ICD-10-CM | POA: Diagnosis not present

## 2023-04-15 DIAGNOSIS — H43813 Vitreous degeneration, bilateral: Secondary | ICD-10-CM | POA: Diagnosis not present

## 2023-04-15 DIAGNOSIS — H2513 Age-related nuclear cataract, bilateral: Secondary | ICD-10-CM | POA: Diagnosis not present

## 2023-04-15 DIAGNOSIS — H5203 Hypermetropia, bilateral: Secondary | ICD-10-CM | POA: Diagnosis not present

## 2023-06-14 DIAGNOSIS — Z1231 Encounter for screening mammogram for malignant neoplasm of breast: Secondary | ICD-10-CM | POA: Diagnosis not present

## 2023-08-27 DIAGNOSIS — Z0000 Encounter for general adult medical examination without abnormal findings: Secondary | ICD-10-CM | POA: Diagnosis not present

## 2023-08-27 DIAGNOSIS — I10 Essential (primary) hypertension: Secondary | ICD-10-CM | POA: Diagnosis not present

## 2023-08-27 DIAGNOSIS — E7800 Pure hypercholesterolemia, unspecified: Secondary | ICD-10-CM | POA: Diagnosis not present

## 2023-08-27 DIAGNOSIS — M8589 Other specified disorders of bone density and structure, multiple sites: Secondary | ICD-10-CM | POA: Diagnosis not present

## 2023-08-27 DIAGNOSIS — R931 Abnormal findings on diagnostic imaging of heart and coronary circulation: Secondary | ICD-10-CM | POA: Diagnosis not present

## 2023-09-02 DIAGNOSIS — I10 Essential (primary) hypertension: Secondary | ICD-10-CM | POA: Diagnosis not present

## 2023-09-02 DIAGNOSIS — I2510 Atherosclerotic heart disease of native coronary artery without angina pectoris: Secondary | ICD-10-CM | POA: Diagnosis not present

## 2023-10-03 ENCOUNTER — Ambulatory Visit: Payer: PPO | Admitting: Allergy and Immunology

## 2023-10-03 ENCOUNTER — Encounter: Payer: Self-pay | Admitting: Allergy and Immunology

## 2023-10-03 VITALS — BP 122/72 | HR 92 | Resp 16 | Ht 61.0 in | Wt 162.0 lb

## 2023-10-03 DIAGNOSIS — J988 Other specified respiratory disorders: Secondary | ICD-10-CM

## 2023-10-03 DIAGNOSIS — J3089 Other allergic rhinitis: Secondary | ICD-10-CM

## 2023-10-03 DIAGNOSIS — B9789 Other viral agents as the cause of diseases classified elsewhere: Secondary | ICD-10-CM

## 2023-10-03 MED ORDER — METHYLPREDNISOLONE ACETATE 80 MG/ML IJ SUSP
80.0000 mg | Freq: Once | INTRAMUSCULAR | Status: AC
  Administered 2023-10-03: 80 mg via INTRAMUSCULAR

## 2023-10-03 NOTE — Patient Instructions (Addendum)
  1. For this recent event:   A. Depomedrol 80 IM delivered in clinic today  B. Lots of nasal saline   C. Cetirizine 10 mg - 1 tablet 2 times per day  D. Mucinex DM - 2 times per day  2. Prevent airway swelling:   A. OTC Nasacort - 1 spray each nostril 3-7 times per week  B. OTC antihistamine 1 time per day  3. Further evaluation and treatment???

## 2023-10-07 ENCOUNTER — Encounter: Payer: Self-pay | Admitting: Allergy and Immunology

## 2023-10-14 DIAGNOSIS — E7800 Pure hypercholesterolemia, unspecified: Secondary | ICD-10-CM | POA: Diagnosis not present

## 2023-10-14 DIAGNOSIS — M8580 Other specified disorders of bone density and structure, unspecified site: Secondary | ICD-10-CM | POA: Diagnosis not present

## 2023-10-14 DIAGNOSIS — Z01419 Encounter for gynecological examination (general) (routine) without abnormal findings: Secondary | ICD-10-CM | POA: Diagnosis not present

## 2023-10-14 DIAGNOSIS — Z780 Asymptomatic menopausal state: Secondary | ICD-10-CM | POA: Diagnosis not present

## 2024-01-14 ENCOUNTER — Encounter: Payer: Self-pay | Admitting: Obstetrics

## 2024-01-14 ENCOUNTER — Other Ambulatory Visit (HOSPITAL_COMMUNITY)
Admission: RE | Admit: 2024-01-14 | Discharge: 2024-01-14 | Disposition: A | Discharge status: Home / Self Care | Payer: PPO | Source: Ambulatory Visit | Attending: Obstetrics | Admitting: Obstetrics

## 2024-01-14 ENCOUNTER — Ambulatory Visit: Payer: PPO | Admitting: Obstetrics

## 2024-01-14 VITALS — BP 132/78 | HR 78 | Ht 60.63 in | Wt 160.6 lb

## 2024-01-14 DIAGNOSIS — N3941 Urge incontinence: Secondary | ICD-10-CM | POA: Diagnosis present

## 2024-01-14 DIAGNOSIS — N8110 Cystocele, unspecified: Secondary | ICD-10-CM | POA: Insufficient documentation

## 2024-01-14 DIAGNOSIS — N3946 Mixed incontinence: Secondary | ICD-10-CM | POA: Insufficient documentation

## 2024-01-14 DIAGNOSIS — N952 Postmenopausal atrophic vaginitis: Secondary | ICD-10-CM | POA: Insufficient documentation

## 2024-01-14 LAB — POCT URINALYSIS DIPSTICK
Bilirubin, UA: NEGATIVE
Blood, UA: NEGATIVE
Glucose, UA: NEGATIVE
Ketones, UA: NEGATIVE
Nitrite, UA: NEGATIVE
Protein, UA: NEGATIVE
Spec Grav, UA: 1.025 (ref 1.010–1.025)
Urobilinogen, UA: 0.2 U/dL
pH, UA: 6 (ref 5.0–8.0)

## 2024-01-14 NOTE — Assessment & Plan Note (Signed)
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   - Rx vaginal estrogen for patient to start

## 2024-01-14 NOTE — Assessment & Plan Note (Signed)
-   reduces 2x/week - minimal bother - For treatment of pelvic organ prolapse, we discussed options for management including expectant management, conservative management, and surgical management, such as Kegels, a pessary, pelvic floor physical therapy, and specific surgical procedures. - reviewed instructions for Kegel exercises

## 2024-01-14 NOTE — Progress Notes (Addendum)
New Patient Evaluation and Consultation  Referring Provider: Maxie Better, MD PCP: Kirby Funk, MD (Inactive) Date of Service: 01/14/2024  SUBJECTIVE Chief Complaint: New Patient (Initial Visit) Sheryl Schmidt is a 73 y.o. female here today for female uterine prolapse. )  History of Present Illness: Sheryl Schmidt is a 73 y.o. White or Caucasian female seen in consultation at the request of Dr Cherly Hensen for evaluation of grade 2 cystocele.    Reports water balloon sensation at vagina to the vaginal opening around 1 month ago after lifting up a 30lb grandchild  Denies vaginal bleeding or discharge or bulge beyond vaginal opening Pelvic pressure, reduces for comfort 2x/week. Denies splinting. History of Bartholin's cyst with surgical intervention after 2nd pregnancy, HSV Started metamucil due to straining for bowel movements during COVID, improved bowel consistency.   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 Reports urgency leakage every 2 months  Day time voids 4-5.  Nocturia: 1 times per night to void. Voiding dysfunction:  empties bladder well.  Patient does not use a catheter to empty bladder.  When urinating, patient feels difficulty starting urine stream Drinks: 64oz water per day, 48oz venti white mocha decaf coffee around 1x/week, 8oz 1/2 and 1/2 tea  UTIs:  0  UTI's in the last year.   Denies history of blood in urine, kidney or bladder stones, pyelonephritis, bladder cancer, and kidney cancer No results found for the last 90 days.   Pelvic Organ Prolapse Symptoms:                  Patient Admits to a feeling of a bulge at the vaginal opening at the end of the day. It has been present for 1 months.  Patient Admits to seeing a bulge.  This bulge is not bothersome.  Bowel Symptom: Bowel movements: 1-2 time(s) per day Stool consistency: soft  Straining: no.   Splinting: no.  Incomplete evacuation: no.  Patient Denies accidental bowel leakage / fecal incontinence Bowel regimen: fiber and metamucil Last colonoscopy: Results 2 polyps removed with internal hemorrhoids noted [facility] Colonoscopy          Upcoming     Colonoscopy (Every 7 Years) Next due on 07/17/2029    07/17/2022  COLONOSCOPY   Only the first 1 history entries have been loaded, but more history exists.                Sexual Function Sexually active: no.  Sexual orientation: Straight Pain with sex: No  Pelvic Pain Denies pelvic pain  Past Medical History:  Past Medical History:  Diagnosis Date   Allergy    Cholelithiasis    Hypertension    Internal hemorrhoid      Past Surgical History:   Past Surgical History:  Procedure Laterality Date   BACK SURGERY  2011   removed L1 and removed a rib   CESAREAN SECTION     CHOLECYSTECTOMY N/A 02/18/2014   Procedure: LAPAROSCOPIC CHOLECYSTECTOMY WITH INTRAOPERATIVE CHOLANGIOGRAM;  Surgeon: Adolph Pollack, MD;  Location: [facility];  Service: General;  Laterality: N/A;   COLONOSCOPY  05/18/2019   Dr.Armbruster   TOTAL HIP ARTHROPLASTY Left 01/25/2022     Past OB/GYN History: OB History  Gravida Para Term Preterm AB Living  3 2 2  1 2   SAB IAB Ectopic Multiple Live Births      2    # Outcome Date GA Lbr Len/2nd  Weight Sex Type Anes PTL Lv  3 Term     F CS-LTranv   LIV  2 AB           1 Term     F CS-LTranv   LIV    Vaginal deliveries: 0,  Forceps/ Vacuum deliveries: 0, Cesarean section: 2 Menopausal: Yes, at age 35s, Denies vaginal bleeding since menopause Contraception: s/p menopause, BTL. Last pap smear was 10/2023 normal per pt.  Any history of abnormal pap smears: no. No results found for: "DIAGPAP", "HPVHIGH", "ADEQPAP"  Medications: Patient has a current medication list which includes the following prescription(s): amlodipine, vitamin c, aspirin, azelastine, benzonatate, cetirizine, coq10, flaxseed  (linseed), garlic, krill oil, nutritional supplements, ondansetron, rosuvastatin, turmeric, and vitamin d.   Allergies: Patient is allergic to losartan potassium, other, sulfonamide derivatives, and telmisartan.   Social History:  Social History   Tobacco Use   Smoking status: Never   Smokeless tobacco: Never  Vaping Use   Vaping status: Never Used  Substance Use Topics   Alcohol use: Not Currently    Comment: rarely   Drug use: No    Relationship status: married Patient lives by herself.   Patient is not employed. Regular exercise: Yes: yardwork, walking History of abuse: No  Family History:   Family History  Problem Relation Age of Onset   COPD Father    Heart disease Father    Asthma Daughter    Colon cancer Maternal Uncle    Diabetes Other    Esophageal cancer Neg Hx    Rectal cancer Neg Hx    Stomach cancer Neg Hx    Colon polyps Neg Hx    Uterine cancer Neg Hx    Bladder Cancer Neg Hx      Review of Systems: Review of Systems  Constitutional:  Positive for weight loss. Negative for fever and malaise/fatigue.  Respiratory:  Negative for cough, shortness of breath and wheezing.   Cardiovascular:  Negative for chest pain, palpitations and leg swelling.  Gastrointestinal:  Negative for abdominal pain, blood in stool and constipation.  Genitourinary:  Negative for dysuria, frequency, hematuria and urgency.       Bulge  Skin:  Negative for rash.  Neurological:  Negative for dizziness, weakness and headaches.  Endo/Heme/Allergies:  Bruises/bleeds easily.  Psychiatric/Behavioral:  Negative for depression. The patient is not nervous/anxious.      OBJECTIVE Physical Exam: Vitals:   01/14/24 0808  BP: 132/78  Pulse: 78  Weight: 160 lb 9.6 oz (72.8 kg)  Height: 5' 0.63" (1.54 m)    Physical Exam Constitutional:      General: She is not in acute distress.    Appearance: Normal appearance.  Genitourinary:     Bladder and urethral meatus normal.     No  lesions in the vagina.     Right Labia: No rash, tenderness, lesions, skin changes or Bartholin's cyst.    Left Labia: No tenderness, lesions, skin changes, Bartholin's cyst or rash.    Vaginal bleeding (minimal bleeding noted from anterior vaginal wall after speculum exam, hemostatic) present.     No vaginal discharge, erythema, tenderness, ulceration or granulation tissue.     Anterior and apical vaginal prolapse present.    Severe vaginal atrophy present.     Right Adnexa: not tender, not full and no mass present.    Left Adnexa: not tender, not full and no mass present.    No cervical motion tenderness, discharge, friability, lesion, polyp or nabothian cyst.  Uterus is prolapsed.     Uterus is not enlarged, fixed, tender or irregular.     No uterine mass detected.    Urethral meatus caruncle not present.    No urethral prolapse, tenderness, mass, hypermobility, discharge or stress urinary incontinence with cough stress test present.     Bladder is not tender, urgency on palpation not present and masses not present.      Pelvic Floor: Levator muscle strength is 4/5.    Levator ani not tender, obturator internus not tender, no asymmetrical contractions present and no pelvic spasms present.    Symmetrical pelvic sensation, anal wink present and BC reflex present. Cardiovascular:     Rate and Rhythm: Normal rate.  Pulmonary:     Effort: Pulmonary effort is normal. No respiratory distress.  Abdominal:     General: There is no distension.     Palpations: Abdomen is soft. There is no mass.     Tenderness: There is no abdominal tenderness.     Hernia: No hernia is present.    Neurological:     Mental Status: She is alert.  Vitals reviewed. Exam conducted with a chaperone present.    POP-Q:   POP-Q  2                                            Aa   2                                           Ba  -4                                              C   3                                             Gh  2                                            Pb  6                                            tvl   -2                                            Ap  -2                                            Bp  -4  D    Post-Void Residual (PVR) by Bladder Scan: In order to evaluate bladder emptying, we discussed obtaining a postvoid residual and patient agreed to this procedure.  Procedure: The ultrasound unit was placed on the patient's abdomen in the suprapubic region after the patient had voided.    Post Void Residual - 01/14/24 0819       Post Void Residual   Post Void Residual 57 mL              Laboratory Results: Lab Results  Component Value Date   COLORU Yellow 01/14/2024   CLARITYU Clear 01/14/2024   GLUCOSEUR Negative 01/14/2024   BILIRUBINUR Negative 01/14/2024   KETONESU Negative 01/14/2024   SPECGRAV 1.025 01/14/2024   RBCUR Negative 01/14/2024   PHUR 6.0 01/14/2024   PROTEINUR Negative 01/14/2024   UROBILINOGEN 0.2 01/14/2024   LEUKOCYTESUR Small (1+) (A) 01/14/2024    Lab Results  Component Value Date   CREATININE 0.73 01/03/2021   CREATININE 0.70 08/13/2020   CREATININE 0.73 02/17/2014    No results found for: "HGBA1C"  Lab Results  Component Value Date   HGB 13.1 01/03/2021     ASSESSMENT AND PLAN Ms. Pesci is a 73 y.o. with:  1. Vaginal atrophy   2. Urge urinary incontinence   3. Pelvic organ prolapse quantification stage 3 cystocele     Vaginal atrophy Assessment & Plan: - thin, pale vaginal epithelium with blood tinged discharge noted from anterior vaginal epithelium likely due to atrophy - For symptomatic vaginal atrophy options include lubrication with a water-based lubricant, personal hygiene measures and barrier protection against wetness, and estrogen replacement in the form of vaginal cream, vaginal tablets, or a time-released vaginal ring.   - Rx vaginal  estrogen for patient to start    Urge urinary incontinence Assessment & Plan: - minimal bother with leakage every 2 months if she delays urge - POCT + leuk, pending culture. Denies UTI symptoms. - We discussed the symptoms of overactive bladder (OAB), which include urinary urgency, urinary frequency, nocturia, with or without urge incontinence.  While we do not know the exact etiology of OAB, several treatment options exist. We discussed management including behavioral therapy (decreasing bladder irritants, urge suppression strategies, timed voids, bladder retraining), physical therapy, medication; for refractory cases posterior tibial nerve stimulation, sacral neuromodulation, and intravesical botulinum toxin injection.  For anticholinergic medications, we discussed the potential side effects of anticholinergics including dry eyes, dry mouth, constipation, cognitive impairment and urinary retention. For Beta-3 agonist medication, we discussed the potential side effect of elevated blood pressure which is more likely to occur in individuals with uncontrolled hypertension. - encouraged Kegels and behavioral modifications at this time  Orders: -     POCT urinalysis dipstick -     Urine Culture; Future  Pelvic organ prolapse quantification stage 3 cystocele Assessment & Plan: - reduces 2x/week - minimal bother - For treatment of pelvic organ prolapse, we discussed options for management including expectant management, conservative management, and surgical management, such as Kegels, a pessary, pelvic floor physical therapy, and specific surgical procedures. - reviewed instructions for Kegel exercises   RTC in 3 months  Loleta Chance, MD

## 2024-01-14 NOTE — Assessment & Plan Note (Addendum)
-   minimal bother with leakage every 2 months if she delays urge - POCT + leuk, pending culture. Denies UTI symptoms. - We discussed the symptoms of overactive bladder (OAB), which include urinary urgency, urinary frequency, nocturia, with or without urge incontinence.  While we do not know the exact etiology of OAB, several treatment options exist. We discussed management including behavioral therapy (decreasing bladder irritants, urge suppression strategies, timed voids, bladder retraining), physical therapy, medication; for refractory cases posterior tibial nerve stimulation, sacral neuromodulation, and intravesical botulinum toxin injection.  For anticholinergic medications, we discussed the potential side effects of anticholinergics including dry eyes, dry mouth, constipation, cognitive impairment and urinary retention. For Beta-3 agonist medication, we discussed the potential side effect of elevated blood pressure which is more likely to occur in individuals with uncontrolled hypertension. - encouraged Kegels and behavioral modifications at this time

## 2024-01-14 NOTE — Patient Instructions (Addendum)
You have a stage 2 (out of 4) prolapse.  We discussed the fact that it is not life threatening but there are several treatment options. For treatment of pelvic organ prolapse, we discussed options for management including expectant management, conservative management, and surgical management, such as Kegels, a pessary, pelvic floor physical therapy, and specific surgical procedures.     We discussed the symptoms of overactive bladder (OAB), which include urinary urgency, urinary frequency, night-time urination, with or without urge incontinence.  We discussed management including behavioral therapy (decreasing bladder irritants by following a bladder diet, urge suppression strategies, timed voids, bladder retraining), physical therapy, medication; and for refractory cases posterior tibial nerve stimulation, sacral neuromodulation, and intravesical botulinum toxin injection.   For vaginal atrophy (thinning of the vaginal tissue that can cause dryness and burning) and UTI prevention we discussed estrogen replacement in the form of vaginal cream.   For vaginal atrophy (thinning of the vaginal tissue that can cause dryness and burning) and UTI prevention we discussed estrogen replacement in the form of vaginal cream.   Consider starting vaginal estrogen therapy nightly for two weeks then 2 times weekly at night. This can be placed with your finger or an applicator inside the vagina and around the urethra.  Please let us know if the prescription is too expensive and we can look for alternative options.   Is vaginal estrogen therapy safe for me? Vaginal estrogen preparations act on the vaginal skin, and only a very tiny amount is absorbed into the bloodstream (0.01%).  They work in a similar way to hand or face cream.  There is minimal absorption and they are therefore perfectly safe. If you have had breast cancer and have persistent troublesome symptoms which aren't settling with vaginal moisturisers and  lubricants, local estrogen treatment may be a possibility, but consultation with your oncologist should take place first.

## 2024-01-15 ENCOUNTER — Encounter: Payer: Self-pay | Admitting: Obstetrics

## 2024-01-15 LAB — URINE CULTURE: Culture: <10000 — AB

## 2024-02-18 DIAGNOSIS — D224 Melanocytic nevi of scalp and neck: Secondary | ICD-10-CM | POA: Diagnosis not present

## 2024-02-18 DIAGNOSIS — D2271 Melanocytic nevi of right lower limb, including hip: Secondary | ICD-10-CM | POA: Diagnosis not present

## 2024-02-18 DIAGNOSIS — L814 Other melanin hyperpigmentation: Secondary | ICD-10-CM | POA: Diagnosis not present

## 2024-02-18 DIAGNOSIS — L821 Other seborrheic keratosis: Secondary | ICD-10-CM | POA: Diagnosis not present

## 2024-02-18 DIAGNOSIS — Z85828 Personal history of other malignant neoplasm of skin: Secondary | ICD-10-CM | POA: Diagnosis not present

## 2024-03-27 ENCOUNTER — Ambulatory Visit: Payer: PPO | Admitting: Obstetrics

## 2024-03-31 ENCOUNTER — Ambulatory Visit: Admitting: Obstetrics

## 2024-04-02 ENCOUNTER — Ambulatory Visit: Payer: PPO | Admitting: Obstetrics

## 2024-04-13 ENCOUNTER — Ambulatory Visit: Admitting: Obstetrics

## 2024-04-14 ENCOUNTER — Ambulatory Visit: Admitting: Obstetrics

## 2024-04-14 ENCOUNTER — Encounter: Payer: Self-pay | Admitting: Obstetrics

## 2024-04-14 VITALS — BP 127/79 | HR 73

## 2024-04-14 DIAGNOSIS — N952 Postmenopausal atrophic vaginitis: Secondary | ICD-10-CM

## 2024-04-14 DIAGNOSIS — N8110 Cystocele, unspecified: Secondary | ICD-10-CM | POA: Diagnosis not present

## 2024-04-14 DIAGNOSIS — N3946 Mixed incontinence: Secondary | ICD-10-CM | POA: Diagnosis not present

## 2024-04-14 NOTE — Assessment & Plan Note (Addendum)
-   minimal bother with leakage every 2 months if she delays urge - 01/14/24 POCT + leuk, <10K on culture. Denies UTI symptoms. - normal simple CMG with positive CST on exam - For treatment of stress urinary incontinence,  non-surgical options include expectant management, weight loss, physical therapy, as well as a pessary.  Surgical options include a midurethral sling, Burch urethropexy, and transurethral injection of a bulking agent. - discussed office procedure with urethral bulking (Bulkamid). We discussed success rate of approximately 70-80% and possible need for second injection. We reviewed that this is not a permanent procedure and the Bulkamid does dissolve over time. Risks reviewed including injury to bladder or urethra, UTI, urinary retention and hematuria.  - Sling: The effectiveness of a midurethral vaginal mesh sling is approximately 85%, and thus, there will be times when you may leak urine after surgery, especially if your bladder is full or if you have a strong cough. There is a balance between making the sling tight enough to treat your leakage but not too tight so that you have long-term difficulty emptying your bladder. A mesh sling will not directly treat overactive bladder/urge incontinence and may worsen it.  There is an FDA safety notification on vaginal mesh procedures for prolapse but NOT mesh slings. We have extensive experience and training with mesh placement and we have close postoperative follow up to identify any potential complications from mesh. It is important to realize that this mesh is a permanent implant that cannot be easily removed. There are rare risks of mesh exposure (2-4%), pain with intercourse (0-7%), and infection (<1%). The risk of mesh exposure if more likely in a woman with risks for poor healing (prior radiation, poorly controlled diabetes, or immunocompromised). The risk of new or worsened chronic pain after mesh implant is more common in women with baseline  chronic pain and/or poorly controlled anxiety or depression. Approximately 2-4% of patients will experience longer-term post-operative voiding dysfunction that may require surgical revision of the sling. We also reviewed that postoperatively, her stream may not be as strong as before surgery.  - pt desires to proceed with midurethral sling - We discussed the symptoms of overactive bladder (OAB), which include urinary urgency, urinary frequency, nocturia, with or without urge incontinence.  While we do not know the exact etiology of OAB, several treatment options exist. We discussed management including behavioral therapy (decreasing bladder irritants, urge suppression strategies, timed voids, bladder retraining), physical therapy, medication; for refractory cases posterior tibial nerve stimulation, sacral neuromodulation, and intravesical botulinum toxin injection.  For anticholinergic medications, we discussed the potential side effects of anticholinergics including dry eyes, dry mouth, constipation, cognitive impairment and urinary retention. For Beta-3 agonist medication, we discussed the potential side effect of elevated blood pressure which is more likely to occur in individuals with uncontrolled hypertension. - encouraged Kegels and behavioral modifications at this time - discussed possible need for additional treatments postop

## 2024-04-14 NOTE — Progress Notes (Signed)
 [facility] Return Visit  SUBJECTIVE  History of Present Illness: Sheryl Schmidt is a 73 y.o. female seen in follow-up for stage III pelvic organ prolapse, urgency urinary incontinence, and vaginal atrophy. Plan at last visit was kegel exercises and trial of vaginal estrogen.   Did not start vaginal estrogen due to concerns for breast cancer and lack of vaginal dryness or sexual activity, started coconut oil and continues to report discomfort from vaginal bulge. Desires surgical intervention.  Prior vaginal bleeding noted at the time of speculum exam at the anterior vaginal wall.   Past Medical History: Patient  has a past medical history of Allergy , Cholelithiasis, Hypertension, and Internal hemorrhoid.   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   Medications: She has a current medication list which includes the following prescription(s): amlodipine, vitamin c, aspirin, azelastine, benzonatate , cetirizine, coq10, flaxseed (linseed), garlic, krill oil, nutritional supplements, ondansetron , rosuvastatin, turmeric, valacyclovir, and vitamin d.   Allergies: Patient is allergic to losartan potassium, other, sulfonamide derivatives, and telmisartan.   Social History: Patient  reports that she has never smoked. She has never used smokeless tobacco. She reports that she does not currently use alcohol. She reports that she does not use drugs.     OBJECTIVE     Physical Exam: Vitals:   04/14/24 0928  BP: 127/79  Pulse: 73   Negative CST with reduction of prolapse using 2 cotton swab.  Reviewed options for patient to return with a full bladder vs. Simple CMG in the office. Pt desires to proceed with simple CMG.  Verbal consent was obtained to perform simple CMG procedure:   Prolapse was reduced  using 2 large cotton swabs. Urethra was prepped with betadine and a 73F catheter was placed and bladder was drained completely. The bladder was then backfilled with sterile water by gravity.  First sensation: 150 First Desire: 230 Strong Desire: 350 Capacity: 350 Cough stress test was positive. Valsalva stress test was positive.  She was was allowed to void on her own.   Interpretation: CMG showed within normal limits sensation, and within normal limits cystometric capacity. Findings positive for stress incontinence, negative for detrusor overactivity.     ASSESSMENT AND PLAN    Sheryl Schmidt is a 73 y.o. with:  1. Pelvic organ prolapse quantification stage 3 cystocele   2. Vaginal atrophy   3. Mixed stress and urge urinary incontinence     Pelvic organ prolapse quantification stage 3 cystocele Assessment & Plan: - reduces 2x/week with bother, pt desires surgical intervention after reviewing options - For treatment of pelvic organ prolapse, we reviewed options for management including expectant management, conservative management, and surgical management, such as Kegels, a pessary, pelvic floor physical therapy, and specific surgical procedures. - declines obliterative procedure - repeat exam standing We discussed two options for apical prolapse repair:  1) vaginal repair without mesh - Pros - safer, no mesh complications - Cons - not as strong as mesh repair, higher risk of recurrence  2) laparoscopic repair with mesh - Pros - stronger, better long-term success - Cons - risks of mesh implant (erosion into vagina or bladder, adhering to the rectum, pain) - these risks are lower than with a vaginal mesh but still exist - desires to proceed with uterine preservation, discussed risks and benefits of uterine preservation. Discussed risk of need for additional workup or  reoperation in the future. Patient denies vaginal bleeding or history of abnormal pap smears - desires possible  sacrospinous ligament suspension, anterior/posterior repair and perineorrhaphy - ROI from OBGYN for most recent pap smear and pending TVUS ordered for patient to complete prior to preop visit - no blood tinged discharge noted on exam today  Orders: -     US  PELVIS TRANSVAGINAL NON-OB (TV ONLY); Future  Vaginal atrophy Assessment & Plan: - thin, pale vaginal epithelium with blood tinged discharge noted from anterior vaginal epithelium likely due to atrophy - For symptomatic vaginal atrophy options include lubrication with a water-based lubricant, personal hygiene measures and barrier protection against wetness, and estrogen replacement in the form of vaginal cream, vaginal tablets, or a time-released vaginal ring.   - pt declined    Mixed stress and urge urinary incontinence Assessment & Plan: - minimal bother with leakage every 2 months if she delays urge - 01/14/24 POCT + leuk, <10K on culture. Denies UTI symptoms. - normal simple CMG with positive CST on exam - For treatment of stress urinary incontinence,  non-surgical options include expectant management, weight loss, physical therapy, as well as a pessary.  Surgical options include a midurethral sling, Burch urethropexy, and transurethral injection of a bulking agent. - discussed office procedure with urethral bulking (Bulkamid). We discussed success rate of approximately 70-80% and possible need for second injection. We reviewed that this is not a permanent procedure and the Bulkamid does dissolve over time. Risks reviewed including injury to bladder or urethra, UTI, urinary retention and hematuria.  - Sling: The effectiveness of a midurethral vaginal mesh sling is approximately 85%, and thus, there will be times when you may leak urine after surgery, especially if your bladder is full or if you have a strong cough. There is a balance between making the sling tight enough to treat your leakage but not too tight so that you have long-term  difficulty emptying your bladder. A mesh sling will not directly treat overactive bladder/urge incontinence and may worsen it.  There is an FDA safety notification on vaginal mesh procedures for prolapse but NOT mesh slings. We have extensive experience and training with mesh placement and we have close postoperative follow up to identify any potential complications from mesh. It is important to realize that this mesh is a permanent implant that cannot be easily removed. There are rare risks of mesh exposure (2-4%), pain with intercourse (0-7%), and infection (<1%). The risk of mesh exposure if more likely in a woman with risks for poor healing (prior radiation, poorly controlled diabetes, or immunocompromised). The risk of new or worsened chronic pain after mesh implant is more common in women with baseline chronic pain and/or poorly controlled anxiety or depression. Approximately 2-4% of patients will experience longer-term post-operative voiding dysfunction that may require surgical revision of the sling. We also reviewed that postoperatively, her stream may not be as strong as before surgery.  - pt desires to proceed with midurethral sling - We discussed the symptoms of overactive bladder (OAB), which include urinary urgency, urinary frequency, nocturia, with or without urge incontinence.  While we do not know the exact etiology of OAB, several treatment options exist. We discussed management including behavioral therapy (decreasing bladder irritants, urge suppression strategies, timed voids, bladder retraining), physical therapy, medication; for refractory cases posterior tibial nerve stimulation, sacral neuromodulation, and intravesical botulinum toxin injection.  For anticholinergic medications, we discussed the potential side effects of anticholinergics including dry eyes, dry mouth, constipation, cognitive impairment  and urinary retention. For Beta-3 agonist medication, we discussed the potential side  effect of elevated blood pressure which is more likely to occur in individuals with uncontrolled hypertension. - encouraged Kegels and behavioral modifications at this time - discussed possible need for additional treatments postop   Plan for surgery: Exam under anesthesia, possible sacrospinous ligament suspension, anterior/posterior repair, perineorrhaphy, midurethral sling, cystourethroscopy  - We reviewed the patient's specific anatomic and functional findings, with the assistance of diagrams, and together finalized the above procedure. The planned surgical procedures were discussed along with the surgical risks outlined below, which were also provided on a detailed handout. Additional treatment options including expectant management, conservative management, medical management were discussed where appropriate.  We reviewed the benefits and risks of each treatment option.   General Surgical Risks: For all procedures, there are risks of bleeding, infection, damage to surrounding organs including but not limited to bowel, bladder, blood vessels, ureters and nerves, and need for further surgery if an injury were to occur. These risks are all low with minimally invasive surgery.   There are risks of numbness and weakness at any body site or buttock/rectal pain.  It is possible that baseline pain can be worsened by surgery, either with or without mesh. If surgery is vaginal, there is also a low risk of possible conversion to laparoscopy or open abdominal incision where indicated. Very rare risks include blood transfusion, blood clot, heart attack, pneumonia, or death.   There is also a risk of short-term postoperative urinary retention with need to use a catheter. About half of patients need to go home from surgery with a catheter, which is then later removed in the office. The risk of long-term need for a catheter is very low. There is also a risk of worsening of overactive bladder.   Sling: The  effectiveness of a midurethral vaginal mesh sling is approximately 85%, and thus, there will be times when you may leak urine after surgery, especially if your bladder is full or if you have a strong cough. There is a balance between making the sling tight enough to treat your leakage but not too tight so that you have long-term difficulty emptying your bladder. A mesh sling will not directly treat overactive bladder/urge incontinence and may worsen it.  There is an FDA safety notification on vaginal mesh procedures for prolapse but NOT mesh slings. We have extensive experience and training with mesh placement and we have close postoperative follow up to identify any potential complications from mesh. It is important to realize that this mesh is a permanent implant that cannot be easily removed. There are rare risks of mesh exposure (2-4%), pain with intercourse (0-7%), and infection (<1%). The risk of mesh exposure if more likely in a woman with risks for poor healing (prior radiation, poorly controlled diabetes, or immunocompromised). The risk of new or worsened chronic pain after mesh implant is more common in women with baseline chronic pain and/or poorly controlled anxiety or depression. Approximately 2-4% of patients will experience longer-term post-operative voiding dysfunction that may require surgical revision of the sling. We also reviewed that postoperatively, her stream may not be as strong as before surgery.   Prolapse (with or without mesh): Risk factors for surgical failure  include things that put pressure on your pelvis and the surgical repair, including obesity, chronic cough, and heavy lifting or straining (including lifting children or adults, straining on the toilet, or lifting heavy objects such as furniture or anything weighing >25 lbs.  Risks of recurrence is 20-30% with vaginal native tissue repair and a less than 10% with sacrocolpopexy with mesh.      - For preop Visit:  She is  required to have a visit within 30 days of her surgery.   - Medical clearance: not required Repeat exam standing at follow-up to reassess apex. Pending pap smear from OBGYN and TVUS - Anticoagulant use: Yes, caprini score 4 - Medicaid Hysterectomy form: No - Accepts blood transfusion: Yes - Expected length of stay: outpatient  Request sent for surgery scheduling.   Time spent: I spent 38 minutes dedicated to the care of this patient on the date of this encounter to include pre-visit review of records, face-to-face time with the patient discussing stage III pelvic organ prolapse, mixed urinary incontinence, uterine preservation,vaginal atrophy, and post visit documentation and ordering imaging outside of simple CMG.   Darlene Ehlers, MD

## 2024-04-14 NOTE — Assessment & Plan Note (Signed)
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   - pt declined

## 2024-04-14 NOTE — Patient Instructions (Addendum)
 We discussed two options for prolapse repair:  1) vaginal repair without mesh - Pros - safer, no mesh complications - Cons - not as strong as mesh repair, higher risk of recurrence  2) laparoscopic repair with mesh - Pros - stronger, better long-term success - Cons - risks of mesh implant (erosion into vagina or bladder, adhering to the rectum, pain) - these risks are lower than with a vaginal mesh but still exist  For treatment of stress urinary incontinence, which is leakage with physical activity/movement/strainging/coughing, we discussed expectant management versus nonsurgical options versus surgery. Nonsurgical options include weight loss, physical therapy, as well as a pessary.  Surgical options include a midurethral sling, which is a synthetic mesh sling that acts like a hammock under the urethra to prevent leakage of urine, a Burch urethropexy, and transurethral injection of a bulking agent.   Sling: The effectiveness of a midurethral vaginal mesh sling is approximately 85%, and thus, there will be times when you may leak urine after surgery, especially if your bladder is full or if you have a strong cough. There is a balance between making the sling tight enough to treat your leakage but not too tight so that you have long-term difficulty emptying your bladder. A mesh sling will not directly treat overactive bladder/urge incontinence and may worsen it.  There is an FDA safety notification on vaginal mesh procedures for prolapse but NOT mesh slings. We have extensive experience and training with mesh placement and we have close postoperative follow up to identify any potential complications from mesh. It is important to realize that this mesh is a permanent implant that cannot be easily removed. There are rare risks of mesh exposure (2-4%), pain with intercourse (0-7%), and infection (<1%). The risk of mesh exposure if more likely in a woman with risks for poor healing (prior radiation, poorly  controlled diabetes, or immunocompromised). The risk of new or worsened chronic pain after mesh implant is more common in women with baseline chronic pain and/or poorly controlled anxiety or depression. Approximately 2-4% of patients will experience longer-term post-operative voiding dysfunction that may require surgical revision of the sling. We also reviewed that postoperatively, her stream may not be as strong as before surgery.   - we discussed office procedure with urethral bulking (Bulkamid). We discussed success rate of approximately 70-80% and possible need for second injection. We reviewed that this is not a permanent procedure and the Bulkamid does dissolve over time. Risks reviewed including injury to bladder or urethra, UTI, urinary retention and hematuria.   Please call radiology at [phone] to schedule your imaging study today

## 2024-04-14 NOTE — Assessment & Plan Note (Addendum)
-   reduces 2x/week with bother, pt desires surgical intervention after reviewing options - For treatment of pelvic organ prolapse, we reviewed options for management including expectant management, conservative management, and surgical management, such as Kegels, a pessary, pelvic floor physical therapy, and specific surgical procedures. - declines obliterative procedure - repeat exam standing We discussed two options for apical prolapse repair:  1) vaginal repair without mesh - Pros - safer, no mesh complications - Cons - not as strong as mesh repair, higher risk of recurrence  2) laparoscopic repair with mesh - Pros - stronger, better long-term success - Cons - risks of mesh implant (erosion into vagina or bladder, adhering to the rectum, pain) - these risks are lower than with a vaginal mesh but still exist - desires to proceed with uterine preservation, discussed risks and benefits of uterine preservation. Discussed risk of need for additional workup or reoperation in the future. Patient denies vaginal bleeding or history of abnormal pap smears - desires possible sacrospinous ligament suspension, anterior/posterior repair and perineorrhaphy - ROI from OBGYN for most recent pap smear and pending TVUS ordered for patient to complete prior to preop visit - no blood tinged discharge noted on exam today

## 2024-04-17 ENCOUNTER — Ambulatory Visit (HOSPITAL_COMMUNITY)
Admission: RE | Admit: 2024-04-17 | Discharge: 2024-04-17 | Disposition: A | Discharge status: Home / Self Care | Source: Ambulatory Visit | Attending: Obstetrics | Admitting: Obstetrics

## 2024-04-17 DIAGNOSIS — N8110 Cystocele, unspecified: Secondary | ICD-10-CM | POA: Insufficient documentation

## 2024-04-17 DIAGNOSIS — N819 Female genital prolapse, unspecified: Secondary | ICD-10-CM | POA: Diagnosis not present

## 2024-04-22 DIAGNOSIS — H04123 Dry eye syndrome of bilateral lacrimal glands: Secondary | ICD-10-CM | POA: Diagnosis not present

## 2024-04-22 DIAGNOSIS — H40013 Open angle with borderline findings, low risk, bilateral: Secondary | ICD-10-CM | POA: Diagnosis not present

## 2024-04-22 DIAGNOSIS — H5203 Hypermetropia, bilateral: Secondary | ICD-10-CM | POA: Diagnosis not present

## 2024-04-22 DIAGNOSIS — H2513 Age-related nuclear cataract, bilateral: Secondary | ICD-10-CM | POA: Diagnosis not present

## 2024-04-23 ENCOUNTER — Ambulatory Visit: Payer: Self-pay | Admitting: Obstetrics

## 2024-05-02 ENCOUNTER — Encounter: Payer: Self-pay | Admitting: Internal Medicine

## 2024-05-02 MED ORDER — AMOXICILLIN-POT CLAVULANATE 875-125 MG PO TABS: 1.0000 | ORAL_TABLET | Freq: Two times a day (BID) | ORAL | 0 refills | Status: AC

## 2024-05-02 MED ORDER — PREDNISONE 20 MG PO TABS: 20.0000 mg | ORAL_TABLET | Freq: Every day | ORAL | 0 refills | Status: AC

## 2024-05-14 ENCOUNTER — Telehealth: Payer: Self-pay | Admitting: Obstetrics

## 2024-05-14 NOTE — Telephone Encounter (Signed)
-----   Message from Nurse Jullie Oiler A sent at 05/08/2024  4:32 PM EDT ----- This pt has questions for you before scheduling surgery. Can you give her a call? Thank you  Burdette Carolin

## 2024-05-14 NOTE — Telephone Encounter (Signed)
 Called patient due to questions regarding surgical planning. She desires to further review timing and route of surgery at her follow-up appt scheduled on 06/1824.  Encouraged pt to review her options and call back if she has any additional questions.

## 2024-06-18 NOTE — Progress Notes (Signed)
 [facility] Return Visit  SUBJECTIVE  History of Present Illness: Sheryl Schmidt is a 73 y.o. female seen in follow-up for stage III pelvic organ prolapse, urgency urinary incontinence, and vaginal atrophy. Plan at last visit was surgical intervention.   Desires to review options for surgical intervention Desired uterine preservation and currently sexually active Pap NILM no cotest 10/14/23 Declined vaginal estrogen due to concerns for breast cancer and lack of vaginal dryness  Started coconut oil and continues to report discomfort from vaginal bulge. Prior vaginal bleeding noted at the time of speculum exam at the anterior vaginal wall.  Simple CMG 04/14/24: First sensation: 150 First Desire: 230 Strong Desire: 350 Capacity: 350 Cough stress test was positive. Valsalva stress test was positive.  She was was allowed to void on her own.   Interpretation: CMG showed within normal limits sensation, and within normal limits cystometric capacity. Findings positive for stress incontinence, negative for detrusor overactivity.    TVUS 04/17/24 CLINICAL DATA:  Pelvic organ prolapse   EXAM: TRANSABDOMINAL AND TRANSVAGINAL ULTRASOUND OF PELVIS   TECHNIQUE: Both transabdominal and transvaginal ultrasound examinations of the pelvis were performed. Transabdominal technique was performed for global imaging of the pelvis including uterus, ovaries, adnexal regions, and pelvic cul-de-sac. It was necessary to proceed with endovaginal exam following the transabdominal exam to visualize the uterus endometrium adnexa.   COMPARISON:  None Available.   FINDINGS: Uterus   Measurements: 5 x 2 x 3.4 cm = volume: 17.4 mL. No fibroids or other mass visualized.   Endometrium   Thickness: 1 mm.  No focal abnormality visualized.   Right ovary   Not seen   Left ovary   Measurements: 1.3 x 0.9 x 1.4 cm = volume: 0.8 mL. Normal appearance/no adnexal mass.   Other findings   No  abnormal free fluid.   IMPRESSION: Nonvisualized right ovary. Otherwise negative pelvic ultrasound.     Electronically Signed   By: Sheryl Schmidt M.D.   On: 04/23/2024 00:38    Past Medical History: Patient  has a past medical history of Allergy , Cholelithiasis, Hypertension, and Internal hemorrhoid.   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   Medications: She has a current medication list which includes the following prescription(s): amlodipine, vitamin c, aspirin, azelastine, benzonatate , cetirizine, coq10, flaxseed (linseed), garlic, krill oil, nutritional supplements, ondansetron , rosuvastatin, turmeric, valacyclovir, and vitamin d.   Allergies: Patient is allergic to losartan potassium, other, sulfonamide derivatives, and telmisartan.   Social History: Patient  reports that she has never smoked. She has never used smokeless tobacco. She reports that she does not currently use alcohol. She reports that she does not use drugs.     OBJECTIVE     Physical Exam: Vitals:   06/19/24 1349  BP: 130/85  Pulse: 78     POP-Q  2                                            Aa   2                                           Ba  -5  C   3                                            Gh  2                                            Pb  7                                            tvl   -2                                            Ap  -2                                            Bp  -6                                              D   Repeat C standing -5     ASSESSMENT AND PLAN    [name] is a 73 y.o. with:  1. Pelvic organ prolapse quantification stage 3 cystocele   2. Mixed stress and urge urinary incontinence      Pelvic organ  prolapse quantification stage 3 cystocele Assessment & Plan: - reduces 2x/week with bother, pt desires surgical intervention after reviewing options - For treatment of pelvic organ prolapse, we reviewed options for management including expectant management, conservative management, and surgical management, such as Kegels, a pessary, pelvic floor physical therapy, and specific surgical procedures. - declines obliterative procedure - repeat exam standing with well suspended apex, discussed increased risk of recurrence if apical prolapse without apical suspension. Patient expresses understanding We discussed two options for apical prolapse repair:  1) vaginal repair without mesh - Pros - safer, no mesh complications - Cons - not as strong as mesh repair, higher risk of recurrence  2) laparoscopic repair with mesh - Pros - stronger, better long-term success - Cons - risks of mesh implant (erosion into vagina or bladder, adhering to the rectum, pain) - these risks are lower than with a vaginal mesh but still exist - desires to proceed with uterine preservation, discussed risks and benefits of uterine preservation. Discussed risk of need for additional workup or reoperation in the future. Patient denies vaginal bleeding or history of abnormal pap smears - desires to proceed with anterior/posterior repair and perineorrhaphy - normal pap smear 10/14/23 and TVUS 04/17/24   Mixed stress and urge urinary incontinence Assessment & Plan: - minimal bother with leakage every 2 months if she delays urge - 01/14/24 POCT + leuk, <10K on culture. Denies UTI symptoms. - 04/14/24 normal simple CMG with positive CST on exam - For treatment of stress urinary incontinence,  non-surgical options include expectant management, weight loss, physical therapy,  as well as a pessary.  Surgical options include a midurethral sling, Burch urethropexy, and transurethral injection of a bulking agent. - discussed office procedure  with urethral bulking (Bulkamid). We discussed success rate of approximately 70-80% and possible need for second injection. We reviewed that this is not a permanent procedure and the Bulkamid does dissolve over time. Risks reviewed including injury to bladder or urethra, UTI, urinary retention and hematuria.  - Sling: The effectiveness of a midurethral vaginal mesh sling is approximately 85%, and thus, there will be times when you may leak urine after surgery, especially if your bladder is full or if you have a strong cough. There is a balance between making the sling tight enough to treat your leakage but not too tight so that you have long-term difficulty emptying your bladder. A mesh sling will not directly treat overactive bladder/urge incontinence and may worsen it.  There is an FDA safety notification on vaginal mesh procedures for prolapse but NOT mesh slings. We have extensive experience and training with mesh placement and we have close postoperative follow up to identify any potential complications from mesh. It is important to realize that this mesh is a permanent implant that cannot be easily removed. There are rare risks of mesh exposure (2-4%), pain with intercourse (0-7%), and infection (<1%). The risk of mesh exposure if more likely in a woman with risks for poor healing (prior radiation, poorly controlled diabetes, or immunocompromised). The risk of new or worsened chronic pain after mesh implant is more common in women with baseline chronic pain and/or poorly controlled anxiety or depression. Approximately 2-4% of patients will experience longer-term post-operative voiding dysfunction that may require surgical revision of the sling. We also reviewed that postoperatively, her stream may not be as strong as before surgery.  - pt desires to proceed with midurethral sling - We discussed the symptoms of overactive bladder (OAB), which include urinary urgency, urinary frequency, nocturia, with or  without urge incontinence.  While we do not know the exact etiology of OAB, several treatment options exist. We discussed management including behavioral therapy (decreasing bladder irritants, urge suppression strategies, timed voids, bladder retraining), physical therapy, medication; for refractory cases posterior tibial nerve stimulation, sacral neuromodulation, and intravesical botulinum toxin injection.  For anticholinergic medications, we discussed the potential side effects of anticholinergics including dry eyes, dry mouth, constipation, cognitive impairment and urinary retention. For Beta-3 agonist medication, we discussed the potential side effect of elevated blood pressure which is more likely to occur in individuals with uncontrolled hypertension. - encouraged Kegels and behavioral modifications - discussed possible need for additional treatments postop    Plan for surgery: Exam under anesthesia, anterior/posterior repair, perineorrhaphy, midurethral sling, cystourethroscopy  - We reviewed the patient's specific anatomic and functional findings, with the assistance of diagrams, and together finalized the above procedure. The planned surgical procedures were discussed along with the surgical risks outlined below, which were also provided on a detailed handout. Additional treatment options including expectant management, conservative management, medical management were discussed where appropriate.  We reviewed the benefits and risks of each treatment option.   General Surgical Risks: For all procedures, there are risks of bleeding, infection, damage to surrounding organs including but not limited to bowel, bladder, blood vessels, ureters and nerves, and need for further surgery if an injury were to occur. These risks are all low with minimally invasive surgery.   There are risks of numbness and weakness at any body site or buttock/rectal pain.  It is  possible that baseline pain can be worsened  by surgery, either with or without mesh. If surgery is vaginal, there is also a low risk of possible conversion to laparoscopy or open abdominal incision where indicated. Very rare risks include blood transfusion, blood clot, heart attack, pneumonia, or death.   There is also a risk of short-term postoperative urinary retention with need to use a catheter. About half of patients need to go home from surgery with a catheter, which is then later removed in the office. The risk of long-term need for a catheter is very low. There is also a risk of worsening of overactive bladder.   Sling: The effectiveness of a midurethral vaginal mesh sling is approximately 85%, and thus, there will be times when you may leak urine after surgery, especially if your bladder is full or if you have a strong cough. There is a balance between making the sling tight enough to treat your leakage but not too tight so that you have long-term difficulty emptying your bladder. A mesh sling will not directly treat overactive bladder/urge incontinence and may worsen it.  There is an FDA safety notification on vaginal mesh procedures for prolapse but NOT mesh slings. We have extensive experience and training with mesh placement and we have close postoperative follow up to identify any potential complications from mesh. It is important to realize that this mesh is a permanent implant that cannot be easily removed. There are rare risks of mesh exposure (2-4%), pain with intercourse (0-7%), and infection (<1%). The risk of mesh exposure if more likely in a woman with risks for poor healing (prior radiation, poorly controlled diabetes, or immunocompromised). The risk of new or worsened chronic pain after mesh implant is more common in women with baseline chronic pain and/or poorly controlled anxiety or depression. Approximately 2-4% of patients will experience longer-term post-operative voiding dysfunction that may require surgical revision of the  sling. We also reviewed that postoperatively, her stream may not be as strong as before surgery.   Prolapse (with or without mesh): Risk factors for surgical failure  include things that put pressure on your pelvis and the surgical repair, including obesity, chronic cough, and heavy lifting or straining (including lifting children or adults, straining on the toilet, or lifting heavy objects such as furniture or anything weighing >25 lbs. Risks of recurrence is 20-30% with vaginal native tissue repair and a less than 10% with sacrocolpopexy with mesh.      - For preop Visit:  She is required to have a visit within 30 days of her surgery.   - Medical clearance: not required  - Anticoagulant use: Yes, caprini score 4 - Medicaid Hysterectomy form: No - Accepts blood transfusion: Yes - Expected length of stay: outpatient  Request sent for surgery scheduling.   Time spent: I spent 31 minutes dedicated to the care of this patient on the date of this encounter to include pre-visit review of records, face-to-face time with the patient discussing stage III pelvic organ prolapse, mixed urinary incontinence, uterine preservation,vaginal atrophy, and post visit documentation.   Lianne ONEIDA Gillis, MD

## 2024-06-19 ENCOUNTER — Ambulatory Visit: Admitting: Obstetrics

## 2024-06-19 ENCOUNTER — Encounter: Payer: Self-pay | Admitting: Obstetrics

## 2024-06-19 VITALS — BP 130/85 | HR 78

## 2024-06-19 DIAGNOSIS — N8110 Cystocele, unspecified: Secondary | ICD-10-CM | POA: Diagnosis not present

## 2024-06-19 DIAGNOSIS — N3946 Mixed incontinence: Secondary | ICD-10-CM | POA: Diagnosis not present

## 2024-06-19 DIAGNOSIS — Z1231 Encounter for screening mammogram for malignant neoplasm of breast: Secondary | ICD-10-CM | POA: Diagnosis not present

## 2024-06-19 NOTE — Patient Instructions (Addendum)
 We will call you to schedule your anterior repair, urethral bulking, possible posterior repair and perineorraphy, urethral bulking, and cystourethroscopy.   We discussed vaginal repair without mesh - Pros - safer, no mesh complications - Cons - not as strong as mesh repair, higher risk of recurrence  Start miralax daily to optimize stool consistency and avoid constipation.

## 2024-06-22 ENCOUNTER — Encounter: Payer: Self-pay | Admitting: Obstetrics

## 2024-06-22 NOTE — Assessment & Plan Note (Signed)
-   reduces 2x/week with bother, pt desires surgical intervention after reviewing options - For treatment of pelvic organ prolapse, we reviewed options for management including expectant management, conservative management, and surgical management, such as Kegels, a pessary, pelvic floor physical therapy, and specific surgical procedures. - declines obliterative procedure - repeat exam standing with well suspended apex, discussed increased risk of recurrence if apical prolapse without apical suspension. Patient expresses understanding We discussed two options for apical prolapse repair:  1) vaginal repair without mesh - Pros - safer, no mesh complications - Cons - not as strong as mesh repair, higher risk of recurrence  2) laparoscopic repair with mesh - Pros - stronger, better long-term success - Cons - risks of mesh implant (erosion into vagina or bladder, adhering to the rectum, pain) - these risks are lower than with a vaginal mesh but still exist - desires to proceed with uterine preservation, discussed risks and benefits of uterine preservation. Discussed risk of need for additional workup or reoperation in the future. Patient denies vaginal bleeding or history of abnormal pap smears - desires to proceed with anterior/posterior repair and perineorrhaphy - normal pap smear 10/14/23 and TVUS 04/17/24

## 2024-06-22 NOTE — Assessment & Plan Note (Signed)
-   minimal bother with leakage every 2 months if she delays urge - 01/14/24 POCT + leuk, <10K on culture. Denies UTI symptoms. - 04/14/24 normal simple CMG with positive CST on exam - For treatment of stress urinary incontinence,  non-surgical options include expectant management, weight loss, physical therapy, as well as a pessary.  Surgical options include a midurethral sling, Burch urethropexy, and transurethral injection of a bulking agent. - discussed office procedure with urethral bulking (Bulkamid). We discussed success rate of approximately 70-80% and possible need for second injection. We reviewed that this is not a permanent procedure and the Bulkamid does dissolve over time. Risks reviewed including injury to bladder or urethra, UTI, urinary retention and hematuria.  - Sling: The effectiveness of a midurethral vaginal mesh sling is approximately 85%, and thus, there will be times when you may leak urine after surgery, especially if your bladder is full or if you have a strong cough. There is a balance between making the sling tight enough to treat your leakage but not too tight so that you have long-term difficulty emptying your bladder. A mesh sling will not directly treat overactive bladder/urge incontinence and may worsen it.  There is an FDA safety notification on vaginal mesh procedures for prolapse but NOT mesh slings. We have extensive experience and training with mesh placement and we have close postoperative follow up to identify any potential complications from mesh. It is important to realize that this mesh is a permanent implant that cannot be easily removed. There are rare risks of mesh exposure (2-4%), pain with intercourse (0-7%), and infection (<1%). The risk of mesh exposure if more likely in a woman with risks for poor healing (prior radiation, poorly controlled diabetes, or immunocompromised). The risk of new or worsened chronic pain after mesh implant is more common in women with  baseline chronic pain and/or poorly controlled anxiety or depression. Approximately 2-4% of patients will experience longer-term post-operative voiding dysfunction that may require surgical revision of the sling. We also reviewed that postoperatively, her stream may not be as strong as before surgery.  - pt desires to proceed with midurethral sling - We discussed the symptoms of overactive bladder (OAB), which include urinary urgency, urinary frequency, nocturia, with or without urge incontinence.  While we do not know the exact etiology of OAB, several treatment options exist. We discussed management including behavioral therapy (decreasing bladder irritants, urge suppression strategies, timed voids, bladder retraining), physical therapy, medication; for refractory cases posterior tibial nerve stimulation, sacral neuromodulation, and intravesical botulinum toxin injection.  For anticholinergic medications, we discussed the potential side effects of anticholinergics including dry eyes, dry mouth, constipation, cognitive impairment and urinary retention. For Beta-3 agonist medication, we discussed the potential side effect of elevated blood pressure which is more likely to occur in individuals with uncontrolled hypertension. - encouraged Kegels and behavioral modifications - discussed possible need for additional treatments postop

## 2024-08-27 DIAGNOSIS — R931 Abnormal findings on diagnostic imaging of heart and coronary circulation: Secondary | ICD-10-CM | POA: Diagnosis not present

## 2024-08-27 DIAGNOSIS — E7800 Pure hypercholesterolemia, unspecified: Secondary | ICD-10-CM | POA: Diagnosis not present

## 2024-08-27 DIAGNOSIS — M8589 Other specified disorders of bone density and structure, multiple sites: Secondary | ICD-10-CM | POA: Diagnosis not present

## 2024-08-27 DIAGNOSIS — Z0000 Encounter for general adult medical examination without abnormal findings: Secondary | ICD-10-CM | POA: Diagnosis not present

## 2024-08-27 DIAGNOSIS — I10 Essential (primary) hypertension: Secondary | ICD-10-CM | POA: Diagnosis not present

## 2024-08-27 DIAGNOSIS — Z23 Encounter for immunization: Secondary | ICD-10-CM | POA: Diagnosis not present

## 2024-09-03 ENCOUNTER — Ambulatory Visit: Admitting: Allergy and Immunology

## 2024-09-03 ENCOUNTER — Encounter: Payer: Self-pay | Admitting: Allergy and Immunology

## 2024-09-03 VITALS — BP 104/66 | HR 80 | Resp 16 | Ht 61.0 in | Wt 161.0 lb

## 2024-09-03 DIAGNOSIS — J988 Other specified respiratory disorders: Secondary | ICD-10-CM | POA: Diagnosis not present

## 2024-09-03 DIAGNOSIS — B9789 Other viral agents as the cause of diseases classified elsewhere: Secondary | ICD-10-CM | POA: Diagnosis not present

## 2024-09-03 DIAGNOSIS — J3089 Other allergic rhinitis: Secondary | ICD-10-CM

## 2024-09-03 MED ORDER — METHYLPREDNISOLONE ACETATE 80 MG/ML IJ SUSP
80.0000 mg | Freq: Once | INTRAMUSCULAR | Status: AC
  Administered 2024-09-03: 80 mg via INTRAMUSCULAR

## 2024-09-03 NOTE — Progress Notes (Signed)
 [place] [facility] [place] - [place] - [place] - [place]   Follow-up Note  Referring Provider: No ref. provider found Primary Provider: Signa Rush, MD (Inactive) Date of Office Visit: 09/03/2024  Subjective:   Sheryl Schmidt (DOB: Aug 18, 1951) is a 73 y.o. female who [facility] in re-evaluation of the following:  HPI: Sheryl Schmidt returns to this clinic in evaluation of allergic rhinitis and a history of Cl+Me-Isothiazolinone contact allergy .  I last saw her in this clinic 03 October 2023.  She was really doing very well while intermittently using some nasal steroid and some antihistamines throughout the year but unfortunately about 10 to 14 days ago she developed acute onset of hoarseness and then coughing and then head fullness and ear fullness without any anosmia and without any of the nasal discharge and without any associated systemic or constitutional symptoms and she is not resolving this issue.  She continues on Nasacort and cetirizine.  Allergies as of 09/03/2024       Reactions   Losartan Potassium Other (See Comments)   Other Other (See Comments)   Sulfonamide Derivatives    REACTION: rash   Telmisartan Other (See Comments)        Medication List    amLODipine 5 MG tablet Commonly known as: NORVASC Take 5 mg by mouth daily.   aspirin 81 MG chewable tablet Chew 81 mg by mouth daily.   azelastine 0.1 % nasal spray Commonly known as: ASTELIN Place into both nostrils as needed. Use in each nostril as directed   cetirizine 10 MG tablet Commonly known as: ZYRTEC Take 10 mg by mouth daily.   CoQ10 200 MG Caps Take 1 capsule by mouth in the morning and at bedtime.   FLAX SEED OIL PO Take by mouth.   Garlic 1000 MG Caps Take 1 capsule by mouth daily.   Krill Oil 500 MG Caps Take 1 capsule by mouth in the morning and at bedtime.   NUTRITIONAL SUPPLEMENT PO Take by mouth. Heart Chews   ondansetron  4 MG  disintegrating tablet Commonly known as: Zofran  ODT Take 1 tablet (4 mg total) by mouth every 8 (eight) hours as needed for nausea or vomiting.   rosuvastatin 10 MG tablet Commonly known as: CRESTOR Take 10 mg by mouth daily.   TURMERIC CURCUMIN PO Take 900 mg by mouth daily.   valACYclovir 500 MG tablet Commonly known as: VALTREX 1 tablet Orally twice a day for 5 days   vitamin C 1000 MG tablet Take 1,000 mg by mouth in the morning and at bedtime.   VITAMIN D PO Take by mouth in the morning and at bedtime.    Past Medical History:  Diagnosis Date   Allergy     Cholelithiasis    Hypertension    Internal hemorrhoid     Past Surgical History:  Procedure Laterality Date   BACK SURGERY  2011   removed L1 and removed a rib   CESAREAN SECTION     CHOLECYSTECTOMY N/A 02/18/2014   Procedure: LAPAROSCOPIC CHOLECYSTECTOMY WITH INTRAOPERATIVE CHOLANGIOGRAM;  Surgeon: Krystal JINNY Russell, MD;  Location: [facility];  Service: General;  Laterality: N/A;   COLONOSCOPY  05/18/2019   Dr.Armbruster   TOTAL HIP ARTHROPLASTY Left 01/25/2022    Review of systems negative except as noted in HPI / PMHx or noted below:  Review of Systems  Constitutional: Negative.   HENT: Negative.    Eyes: Negative.   Respiratory: Negative.  Cardiovascular: Negative.   Gastrointestinal: Negative.   Genitourinary: Negative.   Musculoskeletal: Negative.   Skin: Negative.   Neurological: Negative.   Endo/Heme/Allergies: Negative.   Psychiatric/Behavioral: Negative.       Objective:   Vitals:   09/03/24 1455  BP: 104/66  Pulse: 80  Resp: 16  SpO2: 97%   Height: 5' 1 (154.9 cm)  Weight: 161 lb (73 kg)   Physical Exam Constitutional:      Appearance: She is not diaphoretic.     Comments: Nasal voice  HENT:     Head: Normocephalic.     Right Ear: Tympanic membrane, ear canal and external ear normal.     Left Ear: Tympanic membrane, ear canal and external ear normal.     Nose: Nose  normal. No mucosal edema or rhinorrhea.     Mouth/Throat:     Pharynx: Uvula midline. No oropharyngeal exudate.  Eyes:     Conjunctiva/sclera: Conjunctivae normal.  Neck:     Thyroid : No thyromegaly.     Trachea: Trachea normal. No tracheal tenderness or tracheal deviation.  Cardiovascular:     Rate and Rhythm: Normal rate and regular rhythm.     Heart sounds: Normal heart sounds, S1 normal and S2 normal. No murmur heard. Pulmonary:     Effort: No respiratory distress.     Breath sounds: Normal breath sounds. No stridor. No wheezing or rales.  Lymphadenopathy:     Head:     Right side of head: No tonsillar adenopathy.     Left side of head: No tonsillar adenopathy.     Cervical: No cervical adenopathy.  Skin:    Findings: No erythema or rash.     Nails: There is no clubbing.  Neurological:     Mental Status: She is alert.     Diagnostics: none  Assessment and Plan:   1. Other allergic rhinitis   2. Viral respiratory infection    1. For this recent event:   A. Depomedrol 80 IM delivered in clinic today  B. Lots of nasal saline   C. Cetirizine 10 mg - 1 tablet 2 times per day  D. Mucinex DM - 2 times per day  2. Prevent airway swelling:   A. OTC Nasacort - 1 spray each nostril 3-7 times per week  B. OTC antihistamine 1 time per day  3. Influenza = Tamiflu. Covid = Paxlovid   4. Return to clinic in 1 year or earlier if problem  Sheryl Schmidt appears to have a viral respiratory tract infection and I am going to give her a systemic steroid to deal with the inflammation associated with this event and hopefully she will do well and go back to her usual functioning status which has really been quite good while using some nasal steroids and antihistamines.  If if she does well see her back in this clinic in 1 year or earlier if there is a problem.  Sheryl Denis, MD Allergy  / Immunology  [facility]

## 2024-09-03 NOTE — Patient Instructions (Addendum)
  1. For this recent event:   A. Depomedrol 80 IM delivered in clinic today  B. Lots of nasal saline   C. Cetirizine 10 mg - 1 tablet 2 times per day  D. Mucinex DM - 2 times per day  2. Prevent airway swelling:   A. OTC Nasacort - 1 spray each nostril 3-7 times per week  B. OTC antihistamine 1 time per day  3. Influenza = Tamiflu. Covid = Paxlovid   4. Return to clinic in 1 year or earlier if problem

## 2024-09-04 ENCOUNTER — Telehealth: Payer: Self-pay | Admitting: Allergy and Immunology

## 2024-09-04 MED ORDER — PREDNISONE 20 MG PO TABS
20.0000 mg | ORAL_TABLET | Freq: Every day | ORAL | 0 refills | Status: AC
Start: 2024-09-04 — End: 2024-09-09

## 2024-09-04 NOTE — Telephone Encounter (Signed)
 Patient states she received a steroid shot in clinic yesterday. She woke up with a lot of pressure in her ears and head. She is requesting a Z-pak to be sent in to [facility] on [address] in [place].

## 2024-09-04 NOTE — Telephone Encounter (Signed)
 Patient informed and verbalized understanding. RX sent for prednisone  as patient states she has not noticed any significant relief today.

## 2024-09-07 ENCOUNTER — Encounter: Payer: Self-pay | Admitting: Allergy and Immunology

## 2024-09-10 MED ORDER — PREDNISONE 10 MG PO TABS: ORAL_TABLET | ORAL | 0 refills | Status: AC

## 2024-09-10 NOTE — Telephone Encounter (Signed)
 Called and informed patient of Dr. Rowan message.  Sent [facility] to [facility].

## 2024-09-10 NOTE — Telephone Encounter (Signed)
 Patient states she is still very congested and has a lot of pressure. She did mention she feels just a little better. She wants to know if Dr. Maurilio will send in additional Prednisone  to [facility] [address] [address] [facility] in [place].

## 2024-09-10 NOTE — Addendum Note (Signed)
 Addended by: CASIMIR CHIQUITA POUR on: 09/10/2024 05:10 PM   Modules accepted: Orders

## 2024-10-08 ENCOUNTER — Encounter: Payer: Self-pay | Admitting: Obstetrics

## 2024-10-19 ENCOUNTER — Ambulatory Visit (INDEPENDENT_AMBULATORY_CARE_PROVIDER_SITE_OTHER): Admitting: Obstetrics and Gynecology

## 2024-10-19 ENCOUNTER — Encounter: Payer: Self-pay | Admitting: Obstetrics and Gynecology

## 2024-10-19 VITALS — BP 134/76 | HR 66 | Ht 61.3 in | Wt 159.4 lb

## 2024-10-19 DIAGNOSIS — Z01818 Encounter for other preprocedural examination: Secondary | ICD-10-CM

## 2024-10-19 DIAGNOSIS — N8110 Cystocele, unspecified: Secondary | ICD-10-CM

## 2024-10-19 DIAGNOSIS — N3946 Mixed incontinence: Secondary | ICD-10-CM

## 2024-10-19 MED ORDER — OXYCODONE HCL 5 MG PO TABS: 5.0000 mg | ORAL_TABLET | ORAL | 0 refills | Status: DC | PRN

## 2024-10-19 MED ORDER — POLYETHYLENE GLYCOL 3350 17 GM/SCOOP PO POWD: 17.0000 g | Freq: Every day | ORAL | 0 refills | Status: AC

## 2024-10-19 MED ORDER — ACETAMINOPHEN 500 MG PO TABS: 500.0000 mg | ORAL_TABLET | Freq: Four times a day (QID) | ORAL | 0 refills | Status: AC | PRN

## 2024-10-19 MED ORDER — IBUPROFEN 600 MG PO TABS
600.0000 mg | ORAL_TABLET | Freq: Four times a day (QID) | ORAL | 0 refills | Status: AC | PRN
Start: 2024-10-19 — End: ?

## 2024-10-19 NOTE — Progress Notes (Signed)
 [facility] Pre-Operative Exam  Subjective Chief Complaint: Sheryl Schmidt presents for a preoperative encounter.   History of Present Illness: Sheryl Schmidt is a 73 y.o. female who presents for preoperative visit.  She is scheduled to undergo Exam under anesthesia, anterior and posterior repair, possible sacrospinous ligament suspension, perineorrhaphy, midurethral sling, cystourethroscopy  on 11/04/24.  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   Urodynamics showed: Deferred, normal simple CMG with positive CST on exam   Past Medical History:  Diagnosis Date   Allergy     Cholelithiasis    Hypertension    Internal hemorrhoid      Past Surgical History:  Procedure Laterality Date   BACK SURGERY  2011   removed L1 and removed a rib   CESAREAN SECTION     CHOLECYSTECTOMY N/A 02/18/2014   Procedure: LAPAROSCOPIC CHOLECYSTECTOMY WITH INTRAOPERATIVE CHOLANGIOGRAM;  Surgeon: Krystal JINNY Russell, MD;  Location: [facility];  Service: General;  Laterality: N/A;   COLONOSCOPY  05/18/2019   Dr.Armbruster   TOTAL HIP ARTHROPLASTY Left 01/25/2022    is allergic to losartan potassium, other, sulfonamide derivatives, and telmisartan.   Family History  Problem Relation Age of Onset   COPD Father    Heart disease Father    Asthma Daughter    Colon cancer Maternal Uncle    Diabetes Other    Esophageal cancer Neg Hx    Rectal cancer Neg Hx    Stomach cancer Neg Hx    Colon polyps Neg Hx    Uterine cancer Neg Hx    Bladder Cancer Neg Hx     Social History   Tobacco Use   Smoking status: Never   Smokeless tobacco: Never  Vaping Use   Vaping status: Never Used  Substance Use Topics   Alcohol use: Not Currently    Comment: rarely   Drug use: No     Review of Systems was negative for a full 10 system review except as noted in the  History of Present Illness.   Current Outpatient Medications:    acetaminophen  (TYLENOL ) 500 MG tablet, Take 1 tablet (500 mg total) by mouth every 6 (six) hours as needed (pain)., Disp: 30 tablet, Rfl: 0   amLODipine (NORVASC) 5 MG tablet, Take 5 mg by mouth daily., Disp: , Rfl:    Ascorbic Acid (VITAMIN C) 1000 MG tablet, Take 1,000 mg by mouth in the morning and at bedtime. , Disp: , Rfl:    aspirin 81 MG chewable tablet, Chew 81 mg by mouth daily., Disp: , Rfl:    azelastine (ASTELIN) 0.1 % nasal spray, Place into both nostrils as needed. Use in each nostril as directed, Disp: , Rfl:    cetirizine (ZYRTEC) 10 MG tablet, Take 10 mg by mouth daily., Disp: , Rfl:    Coenzyme Q10 (COQ10) 200 MG CAPS, Take 1 capsule by mouth in the morning and at bedtime. , Disp: , Rfl:    Flaxseed, Linseed, (FLAX SEED OIL PO), Take by mouth., Disp: , Rfl:    Garlic 1000 MG CAPS, Take 1 capsule by mouth daily., Disp: , Rfl:    ibuprofen (ADVIL) 600 MG tablet, Take 1 tablet (600 mg total) by mouth every 6 (six) hours as needed., Disp: 30 tablet, Rfl: 0   Krill Oil 500 MG CAPS, Take 1 capsule by mouth in the morning and at bedtime.,  Disp: , Rfl:    Nutritional Supplements (NUTRITIONAL SUPPLEMENT PO), Take by mouth. Heart Chews, Disp: , Rfl:    ondansetron  (ZOFRAN  ODT) 4 MG disintegrating tablet, Take 1 tablet (4 mg total) by mouth every 8 (eight) hours as needed for nausea or vomiting., Disp: 20 tablet, Rfl: 0   oxyCODONE  (OXY IR/ROXICODONE ) 5 MG immediate release tablet, Take 1 tablet (5 mg total) by mouth every 4 (four) hours as needed for severe pain (pain score 7-10)., Disp: 15 tablet, Rfl: 0   polyethylene glycol powder (GLYCOLAX/MIRALAX) 17 GM/SCOOP powder, Take 17 g by mouth daily. Drink 17g (1 scoop) dissolved in water per day., Disp: 255 g, Rfl: 0   predniSONE  (DELTASONE ) 10 MG tablet, Take one tablet by mouth once daily for seven days., Disp: 7 tablet, Rfl: 0   rosuvastatin (CRESTOR) 10 MG tablet, Take 10  mg by mouth daily., Disp: , Rfl:    TURMERIC CURCUMIN PO, Take 900 mg by mouth daily. , Disp: , Rfl:    valACYclovir (VALTREX) 500 MG tablet, 1 tablet Orally twice a day for 5 days, Disp: , Rfl:    VITAMIN D PO, Take by mouth in the morning and at bedtime., Disp: , Rfl:    Objective Vitals:   10/19/24 0841  BP: 134/76  Pulse: 66    Gen: NAD CV: S1 S2 RRR Lungs: Clear to auscultation bilaterally Abd: soft, nontender   Previous Pelvic Exam showed: POP-Q   2                                            Aa   2                                           Ba   -4                                              C    3                                            Gh   2                                            Pb   6                                            tvl    -2                                            Ap   -2  Bp   -4                                               Assessment/ Plan  Assessment: The patient is a 73 y.o. year old scheduled to undergo Exam under anesthesia, anterior and posterior repair, possible sacrospinous ligament suspension, perineorrhaphy, midurethral sling, cystourethroscopy. Verbal consent was obtained for these procedures.  Plan: General Surgical Consent: The patient has previously been counseled on alternative treatments, and the decision by the patient and provider was to proceed with the procedure listed above.  For all procedures, there are risks of bleeding, infection, damage to surrounding organs including but not limited to bowel, bladder, blood vessels, ureters and nerves, and need for further surgery if an injury were to occur. These risks are all low with minimally invasive surgery.   There are risks of numbness and weakness at any body site or buttock/rectal pain.  It is possible that baseline pain can be worsened by surgery, either with or without mesh. If surgery is vaginal, there is also a  low risk of possible conversion to laparoscopy or open abdominal incision where indicated. Very rare risks include blood transfusion, blood clot, heart attack, pneumonia, or death.   There is also a risk of short-term postoperative urinary retention with need to use a catheter. About half of patients need to go home from surgery with a catheter, which is then later removed in the office. The risk of long-term need for a catheter is very low. There is also a risk of worsening of overactive bladder.   Sling: The effectiveness of a midurethral vaginal mesh sling is approximately 85%, and thus, there will be times when you may leak urine after surgery, especially if your bladder is full or if you have a strong cough. There is a balance between making the sling tight enough to treat your leakage but not too tight so that you have long-term difficulty emptying your bladder. A mesh sling will not directly treat overactive bladder/urge incontinence and may worsen it.  There is an FDA safety notification on vaginal mesh procedures for prolapse but NOT mesh slings. We have extensive experience and training with mesh placement and we have close postoperative follow up to identify any potential complications from mesh. It is important to realize that this mesh is a permanent implant that cannot be easily removed. There are rare risks of mesh exposure (2-4%), pain with intercourse (0-7%), and infection (<1%). The risk of mesh exposure if more likely in a woman with risks for poor healing (prior radiation, poorly controlled diabetes, or immunocompromised). The risk of new or worsened chronic pain after mesh implant is more common in women with baseline chronic pain and/or poorly controlled anxiety or depression. Approximately 2-4% of patients will experience longer-term post-operative voiding dysfunction that may require surgical revision of the sling. We also reviewed that postoperatively, her stream may not be as strong as  before surgery.   Prolapse (with or without mesh): Risk factors for surgical failure  include things that put pressure on your pelvis and the surgical repair, including obesity, chronic cough, and heavy lifting or straining (including lifting children or adults, straining on the toilet, or lifting heavy objects such as furniture or anything weighing >25 lbs. Risks of recurrence is 20-30% with vaginal native tissue repair and a less than 10% with sacrocolpopexy with  mesh.    We discussed consent for blood products. Risks for blood transfusion include allergic reactions, other reactions that can affect different body organs and managed accordingly, transmission of infectious diseases such as HIV or Hepatitis. However, the blood is screened. Patient consents for blood products.  Pre-operative instructions:  She was instructed to not take Aspirin/NSAIDs x 7days prior to surgery.  Antibiotic prophylaxis was ordered as indicated.  Catheter use: Patient will go home with foley if needed after post-operative voiding trial.  Post-operative instructions:  She was provided with specific post-operative instructions, including precautions and signs/symptoms for which we would recommend contacting us , in addition to daytime and after-hours contact phone numbers. This was provided on a handout.   Post-operative medications: Prescriptions for motrin, tylenol , miralax, and oxycodone  were sent to her pharmacy. Discussed using ibuprofen and tylenol  on a schedule to limit use of narcotics.   Laboratory testing:  We will check labs: As requested by anesthesia  Preoperative clearance:  She does not require surgical clearance.    Post-operative follow-up:  A post-operative appointment will be made for 6 weeks from the date of surgery. If she needs a post-operative nurse visit for a voiding trial, that will be set up after she leaves the hospital.    Patient will call the clinic or use MyChart should anything change or  any new issues arise.   Quinzell Malcomb G Quetzally Callas, NP

## 2024-10-19 NOTE — H&P (Signed)
 [facility] H&P  Subjective Chief Complaint: Sheryl Schmidt presents for a preoperative encounter.   History of Present Illness: Sheryl Schmidt is a 73 y.o. female who presents for preoperative visit.  She is scheduled to undergo Exam under anesthesia, anterior and posterior repair, possible sacrospinous ligament suspension, perineorrhaphy, midurethral sling, cystourethroscopy  on 11/04/24.  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   Urodynamics showed: Deferred, normal simple CMG with positive CST on exam   Past Medical History:  Diagnosis Date   Allergy     Cholelithiasis    Hypertension    Internal hemorrhoid      Past Surgical History:  Procedure Laterality Date   BACK SURGERY  2011   removed L1 and removed a rib   CESAREAN SECTION     CHOLECYSTECTOMY N/A 02/18/2014   Procedure: LAPAROSCOPIC CHOLECYSTECTOMY WITH INTRAOPERATIVE CHOLANGIOGRAM;  Surgeon: Krystal JINNY Russell, MD;  Location: [facility];  Service: General;  Laterality: N/A;   COLONOSCOPY  05/18/2019   Dr.Armbruster   TOTAL HIP ARTHROPLASTY Left 01/25/2022    is allergic to losartan potassium, other, sulfonamide derivatives, and telmisartan.   Family History  Problem Relation Age of Onset   COPD Father    Heart disease Father    Asthma Daughter    Colon cancer Maternal Uncle    Diabetes Other    Esophageal cancer Neg Hx    Rectal cancer Neg Hx    Stomach cancer Neg Hx    Colon polyps Neg Hx    Uterine cancer Neg Hx    Bladder Cancer Neg Hx     Social History   Tobacco Use   Smoking status: Never   Smokeless tobacco: Never  Vaping Use   Vaping status: Never Used  Substance Use Topics   Alcohol use: Not Currently    Comment: rarely   Drug use: No     Review of Systems was negative for a full 10 system review except as noted in the History of Present  Illness.  No current facility-administered medications for this encounter.  Current Outpatient Medications:    acetaminophen  (TYLENOL ) 500 MG tablet, Take 1 tablet (500 mg total) by mouth every 6 (six) hours as needed (pain)., Disp: 30 tablet, Rfl: 0   amLODipine (NORVASC) 5 MG tablet, Take 5 mg by mouth daily., Disp: , Rfl:    Ascorbic Acid (VITAMIN C) 1000 MG tablet, Take 1,000 mg by mouth in the morning and at bedtime. , Disp: , Rfl:    aspirin 81 MG chewable tablet, Chew 81 mg by mouth daily., Disp: , Rfl:    azelastine (ASTELIN) 0.1 % nasal spray, Place into both nostrils as needed. Use in each nostril as directed, Disp: , Rfl:    cetirizine (ZYRTEC) 10 MG tablet, Take 10 mg by mouth daily., Disp: , Rfl:    Coenzyme Q10 (COQ10) 200 MG CAPS, Take 1 capsule by mouth in the morning and at bedtime. , Disp: , Rfl:    Flaxseed, Linseed, (FLAX SEED OIL PO), Take by mouth., Disp: , Rfl:    Garlic 1000 MG CAPS, Take 1 capsule by mouth daily., Disp: , Rfl:    ibuprofen (ADVIL) 600 MG tablet, Take 1 tablet (600 mg total) by mouth every 6 (six) hours as needed., Disp: 30 tablet, Rfl: 0   Krill Oil 500 MG CAPS, Take 1 capsule by mouth  in the morning and at bedtime., Disp: , Rfl:    Nutritional Supplements (NUTRITIONAL SUPPLEMENT PO), Take by mouth. Heart Chews, Disp: , Rfl:    ondansetron  (ZOFRAN  ODT) 4 MG disintegrating tablet, Take 1 tablet (4 mg total) by mouth every 8 (eight) hours as needed for nausea or vomiting., Disp: 20 tablet, Rfl: 0   oxyCODONE  (OXY IR/ROXICODONE ) 5 MG immediate release tablet, Take 1 tablet (5 mg total) by mouth every 4 (four) hours as needed for severe pain (pain score 7-10)., Disp: 15 tablet, Rfl: 0   polyethylene glycol powder (GLYCOLAX/MIRALAX) 17 GM/SCOOP powder, Take 17 g by mouth daily. Drink 17g (1 scoop) dissolved in water per day., Disp: 255 g, Rfl: 0   predniSONE  (DELTASONE ) 10 MG tablet, Take one tablet by mouth once daily for seven days., Disp: 7 tablet, Rfl: 0    rosuvastatin (CRESTOR) 10 MG tablet, Take 10 mg by mouth daily., Disp: , Rfl:    TURMERIC CURCUMIN PO, Take 900 mg by mouth daily. , Disp: , Rfl:    valACYclovir (VALTREX) 500 MG tablet, 1 tablet Orally twice a day for 5 days, Disp: , Rfl:    VITAMIN D PO, Take by mouth in the morning and at bedtime., Disp: , Rfl:    Objective There were no vitals filed for this visit.   Gen: NAD CV: S1 S2 RRR Lungs: Clear to auscultation bilaterally Abd: soft, nontender   Previous Pelvic Exam showed: POP-Q   2                                            Aa   2                                           Ba   -4                                              C    3                                            Gh   2                                            Pb   6                                            tvl    -2                                            Ap   -2  Bp   -4                                               Assessment/ Plan  Assessment: The patient is a 73 y.o. year old scheduled to undergo Exam under anesthesia, anterior and posterior repair, possible sacrospinous ligament suspension, perineorrhaphy, midurethral sling, cystourethroscopy. Verbal consent was obtained for these procedures.

## 2024-10-20 ENCOUNTER — Encounter: Payer: Self-pay | Admitting: Obstetrics and Gynecology

## 2024-10-21 MED ORDER — TRAMADOL HCL 50 MG PO TABS: 50.0000 mg | ORAL_TABLET | Freq: Three times a day (TID) | ORAL | 0 refills | Status: AC | PRN

## 2024-10-21 NOTE — Addendum Note (Signed)
 Addended by: Monia Timmers G on: 10/21/2024 11:40 AM   Modules accepted: Orders

## 2024-10-21 NOTE — Telephone Encounter (Signed)
 Rx for Oxy has been canceled.

## 2024-10-22 DIAGNOSIS — M85851 Other specified disorders of bone density and structure, right thigh: Secondary | ICD-10-CM | POA: Diagnosis not present

## 2024-10-22 DIAGNOSIS — M85832 Other specified disorders of bone density and structure, left forearm: Secondary | ICD-10-CM | POA: Diagnosis not present

## 2024-10-26 ENCOUNTER — Encounter (HOSPITAL_COMMUNITY): Payer: Self-pay | Admitting: Obstetrics

## 2024-10-26 NOTE — Progress Notes (Signed)
 Spoke w/ via phone for pre-op interview--- [facility] Lab needs dos----   BMP and EKG per anesthesia.       Lab results------ COVID test -----patient states asymptomatic no test needed Arrive at -------0630 NPO after MN NO Solid Food.   Pre-Surgery Ensure or G2:  Med rec completed Medications to take morning of surgery -----Norvasc Diabetic medication -----  GLP1 agonist last dose: GLP1 instructions:  Patient instructed no nail polish to be worn day of surgery Patient instructed to bring photo id and insurance card day of surgery Patient aware to have Driver (ride ) / caregiver    for 24 hours after surgery - Sister Illona Haddock Patient Special Instructions ----- Pre-Op special Instructions -----  Patient verbalized understanding of instructions that were given at this phone interview. Patient denies chest pain, sob, fever, cough at the interview.

## 2024-11-04 ENCOUNTER — Other Ambulatory Visit: Payer: Self-pay

## 2024-11-04 ENCOUNTER — Ambulatory Visit (HOSPITAL_COMMUNITY): Admitting: Anesthesiology

## 2024-11-04 ENCOUNTER — Telehealth: Payer: Self-pay | Admitting: Obstetrics

## 2024-11-04 ENCOUNTER — Encounter (HOSPITAL_COMMUNITY): Payer: Self-pay | Admitting: Obstetrics

## 2024-11-04 ENCOUNTER — Encounter (HOSPITAL_COMMUNITY)
Admission: RE | Disposition: A | Discharge status: Home / Self Care | Payer: Self-pay | Source: Ambulatory Visit | Attending: Obstetrics

## 2024-11-04 ENCOUNTER — Ambulatory Visit (HOSPITAL_COMMUNITY)
Admission: RE | Admit: 2024-11-04 | Discharge: 2024-11-04 | Disposition: A | Discharge status: Home / Self Care | Source: Ambulatory Visit | Attending: Obstetrics | Admitting: Obstetrics

## 2024-11-04 DIAGNOSIS — I10 Essential (primary) hypertension: Secondary | ICD-10-CM

## 2024-11-04 DIAGNOSIS — Z79899 Other long term (current) drug therapy: Secondary | ICD-10-CM | POA: Diagnosis not present

## 2024-11-04 DIAGNOSIS — Z7982 Long term (current) use of aspirin: Secondary | ICD-10-CM | POA: Diagnosis not present

## 2024-11-04 DIAGNOSIS — N3946 Mixed incontinence: Secondary | ICD-10-CM

## 2024-11-04 DIAGNOSIS — Z7952 Long term (current) use of systemic steroids: Secondary | ICD-10-CM | POA: Diagnosis not present

## 2024-11-04 DIAGNOSIS — N813 Complete uterovaginal prolapse: Secondary | ICD-10-CM | POA: Diagnosis not present

## 2024-11-04 DIAGNOSIS — N8110 Cystocele, unspecified: Secondary | ICD-10-CM

## 2024-11-04 DIAGNOSIS — Z833 Family history of diabetes mellitus: Secondary | ICD-10-CM | POA: Diagnosis not present

## 2024-11-04 HISTORY — PX: ANTERIOR AND POSTERIOR REPAIR: SHX5121

## 2024-11-04 HISTORY — PX: CYSTOSCOPY: SHX5120

## 2024-11-04 HISTORY — PX: PERINEOPLASTY: SHX2218

## 2024-11-04 HISTORY — PX: BLADDER SUSPENSION: SHX72

## 2024-11-04 LAB — POCT I-STAT, CHEM 8
BUN: 10 mg/dL (ref 8–23)
Calcium, Ion: 1.19 mmol/L (ref 1.15–1.40)
Chloride: 104 mmol/L (ref 98–111)
Creatinine, Ser: 0.8 mg/dL (ref 0.44–1.00)
Glucose, Bld: 80 mg/dL (ref 70–99)
HCT: 39 % (ref 36.0–46.0)
Hemoglobin: 13.3 g/dL (ref 12.0–15.0)
Potassium: 3.8 mmol/L (ref 3.5–5.1)
Sodium: 142 mmol/L (ref 135–145)
TCO2: 25 mmol/L (ref 22–32)

## 2024-11-04 SURGERY — ANTERIOR (CYSTOCELE) AND POSTERIOR REPAIR (RECTOCELE)
Anesthesia: General | Site: Vagina

## 2024-11-04 MED ORDER — 0.9 % SODIUM CHLORIDE (POUR BTL) OPTIME
TOPICAL | Status: DC | PRN
  Administered 2024-11-04: 1000 mL

## 2024-11-04 MED ORDER — GABAPENTIN 300 MG PO CAPS: 300.0000 mg | ORAL_CAPSULE | ORAL | Status: AC

## 2024-11-04 MED ORDER — ACETAMINOPHEN 500 MG PO TABS
ORAL_TABLET | ORAL | Status: AC
  Administered 2024-11-04: 1000 mg via ORAL
  Filled 2024-11-04: qty 2

## 2024-11-04 MED ORDER — DEXAMETHASONE SOD PHOSPHATE PF 10 MG/ML IJ SOLN
INTRAMUSCULAR | Status: DC | PRN
  Administered 2024-11-04: 10 mg via INTRAVENOUS

## 2024-11-04 MED ORDER — ONDANSETRON HCL 4 MG/2ML IJ SOLN
INTRAMUSCULAR | Status: DC | PRN
  Administered 2024-11-04: 4 mg via INTRAVENOUS

## 2024-11-04 MED ORDER — SUGAMMADEX SODIUM 200 MG/2ML IV SOLN
INTRAVENOUS | Status: DC | PRN
  Administered 2024-11-04: 200 mg via INTRAVENOUS

## 2024-11-04 MED ORDER — MIDAZOLAM HCL 2 MG/2ML IJ SOLN
INTRAMUSCULAR | Status: AC
  Filled 2024-11-04: qty 2

## 2024-11-04 MED ORDER — PHENAZOPYRIDINE HCL 100 MG PO TABS
ORAL_TABLET | ORAL | Status: AC
  Administered 2024-11-04: 200 mg via ORAL
  Filled 2024-11-04: qty 2

## 2024-11-04 MED ORDER — ENOXAPARIN SODIUM 40 MG/0.4ML IJ SOSY
PREFILLED_SYRINGE | INTRAMUSCULAR | Status: AC
  Administered 2024-11-04: 40 mg via SUBCUTANEOUS
  Filled 2024-11-04: qty 0.4

## 2024-11-04 MED ORDER — FENTANYL CITRATE (PF) 250 MCG/5ML IJ SOLN
INTRAMUSCULAR | Status: DC | PRN
  Administered 2024-11-04: 100 ug via INTRAVENOUS

## 2024-11-04 MED ORDER — ENOXAPARIN SODIUM 40 MG/0.4ML IJ SOSY: 40.0000 mg | PREFILLED_SYRINGE | INTRAMUSCULAR | Status: AC

## 2024-11-04 MED ORDER — ORAL CARE MOUTH RINSE: 15.0000 mL | Freq: Once | OROMUCOSAL | Status: AC

## 2024-11-04 MED ORDER — LACTATED RINGERS IV SOLN: INTRAVENOUS | Status: DC

## 2024-11-04 MED ORDER — LIDOCAINE 2% (20 MG/ML) 5 ML SYRINGE
INTRAMUSCULAR | Status: DC | PRN
  Administered 2024-11-04: 30 mg via INTRAVENOUS

## 2024-11-04 MED ORDER — PROPOFOL 10 MG/ML IV BOLUS
INTRAVENOUS | Status: AC
  Filled 2024-11-04: qty 20

## 2024-11-04 MED ORDER — DROPERIDOL 2.5 MG/ML IJ SOLN: 0.6250 mg | Freq: Once | INTRAMUSCULAR | Status: DC | PRN

## 2024-11-04 MED ORDER — SODIUM CHLORIDE (PF) 0.9 % IJ SOLN
INTRAMUSCULAR | Status: AC
  Filled 2024-11-04: qty 40

## 2024-11-04 MED ORDER — OXYCODONE HCL 5 MG PO TABS: 5.0000 mg | ORAL_TABLET | Freq: Once | ORAL | Status: DC | PRN

## 2024-11-04 MED ORDER — PHENAZOPYRIDINE HCL 100 MG PO TABS: 200.0000 mg | ORAL_TABLET | ORAL | Status: AC

## 2024-11-04 MED ORDER — ACETAMINOPHEN 500 MG PO TABS: 1000.0000 mg | ORAL_TABLET | ORAL | Status: AC

## 2024-11-04 MED ORDER — FENTANYL CITRATE (PF) 100 MCG/2ML IJ SOLN: 25.0000 ug | INTRAMUSCULAR | Status: DC | PRN

## 2024-11-04 MED ORDER — MIDAZOLAM HCL (PF) 2 MG/2ML IJ SOLN
INTRAMUSCULAR | Status: DC | PRN
  Administered 2024-11-04: 2 mg via INTRAVENOUS

## 2024-11-04 MED ORDER — FENTANYL CITRATE (PF) 250 MCG/5ML IJ SOLN
INTRAMUSCULAR | Status: AC
  Filled 2024-11-04: qty 5

## 2024-11-04 MED ORDER — SODIUM CHLORIDE 0.9 % IR SOLN
Status: DC | PRN
  Administered 2024-11-04: 1000 mL via INTRAVESICAL

## 2024-11-04 MED ORDER — PROPOFOL 10 MG/ML IV BOLUS
INTRAVENOUS | Status: DC | PRN
  Administered 2024-11-04: 130 mg via INTRAVENOUS

## 2024-11-04 MED ORDER — OXYCODONE HCL 5 MG/5ML PO SOLN: 5.0000 mg | Freq: Once | ORAL | Status: DC | PRN

## 2024-11-04 MED ORDER — SODIUM CHLORIDE 0.9 % IV SOLN
2.0000 g | INTRAVENOUS | Status: AC
  Administered 2024-11-04: 2 g via INTRAVENOUS

## 2024-11-04 MED ORDER — LIDOCAINE-EPINEPHRINE 1 %-1:100000 IJ SOLN
INTRAMUSCULAR | Status: DC | PRN
  Administered 2024-11-04: 40 mL

## 2024-11-04 MED ORDER — CHLORHEXIDINE GLUCONATE 0.12 % MT SOLN: 15.0000 mL | Freq: Once | OROMUCOSAL | Status: AC

## 2024-11-04 MED ORDER — CHLORHEXIDINE GLUCONATE 0.12 % MT SOLN
OROMUCOSAL | Status: AC
  Administered 2024-11-04: 15 mL via OROMUCOSAL
  Filled 2024-11-04: qty 15

## 2024-11-04 MED ORDER — LIDOCAINE-EPINEPHRINE 1 %-1:100000 IJ SOLN
INTRAMUSCULAR | Status: AC
  Filled 2024-11-04: qty 2

## 2024-11-04 MED ORDER — ROCURONIUM BROMIDE 10 MG/ML (PF) SYRINGE
PREFILLED_SYRINGE | INTRAVENOUS | Status: DC | PRN
  Administered 2024-11-04: 50 mg via INTRAVENOUS

## 2024-11-04 MED ORDER — GABAPENTIN 300 MG PO CAPS
ORAL_CAPSULE | ORAL | Status: AC
  Administered 2024-11-04: 300 mg via ORAL
  Filled 2024-11-04: qty 1

## 2024-11-04 MED ORDER — EPHEDRINE SULFATE-NACL 50-0.9 MG/10ML-% IV SOSY
PREFILLED_SYRINGE | INTRAVENOUS | Status: DC | PRN
  Administered 2024-11-04: 5 mg via INTRAVENOUS

## 2024-11-04 MED ORDER — SODIUM CHLORIDE 0.9 % IV SOLN
INTRAVENOUS | Status: DC
  Filled 2024-11-04: qty 2

## 2024-11-04 SURGICAL SUPPLY — 36 items
BLADE CLIPPER SENSICLIP SURGIC (BLADE) ×4 IMPLANT
BLADE SURG 15 STRL LF DISP TIS (BLADE) ×4 IMPLANT
DERMABOND ADVANCED .7 DNX12 (GAUZE/BANDAGES/DRESSINGS) ×4 IMPLANT
GAUZE 4X4 16PLY ~~LOC~~+RFID DBL (SPONGE) IMPLANT
GLOVE BIOGEL PI IND STRL 6 (GLOVE) ×4 IMPLANT
GLOVE BIOGEL PI MICRO STRL 5.5 (GLOVE) ×4 IMPLANT
GLOVE SS PI 5.5 STRL (GLOVE) ×4 IMPLANT
GOWN STRL REUS W/ TWL LRG LVL3 (GOWN DISPOSABLE) ×4 IMPLANT
HIBICLENS CHG 4% 4OZ BTL (MISCELLANEOUS) ×4 IMPLANT
HOLDER FOLEY CATH W/STRAP (MISCELLANEOUS) ×4 IMPLANT
KIT TURNOVER KIT B (KITS) ×4 IMPLANT
NDL HYPO 22X1.5 SAFETY MO (MISCELLANEOUS) ×4 IMPLANT
NDL SPNL 22GX3.5 QUINCKE BK (NEEDLE) IMPLANT
PACK VAGINAL WOMENS (CUSTOM PROCEDURE TRAY) ×4 IMPLANT
PAD OB MATERNITY 11 LF (PERSONAL CARE ITEMS) ×4 IMPLANT
RETRACTOR LONE STAR DISPOSABLE (INSTRUMENTS) ×4 IMPLANT
RETRACTOR STAY HOOK 5MM (MISCELLANEOUS) ×4 IMPLANT
SET CYSTO IRRIGATION (SET/KITS/TRAYS/PACK) ×4 IMPLANT
SET IRRIG Y-TYPE CYSTO (SET/KITS/TRAYS/PACK) ×4 IMPLANT
SLEEVE SCD COMPRESS KNEE MED (STOCKING) ×4 IMPLANT
SLING ADVANTAGE FIT TRANVAG (Sling) IMPLANT
SOLN 0.9% NACL POUR BTL 1000ML (IV SOLUTION) ×4 IMPLANT
SPIKE FLUID TRANSFER (MISCELLANEOUS) ×4 IMPLANT
SUCTION TUBE FRAZIER 10FR DISP (SUCTIONS) ×4 IMPLANT
SURGIFLO W/THROMBIN 8M KIT (HEMOSTASIS) IMPLANT
SUT MON AB 2-0 SH27 (SUTURE) IMPLANT
SUT PDS AB 2-0 CT2 27 (SUTURE) ×4 IMPLANT
SUT VIC AB 0 CT1 27XBRD ANBCTR (SUTURE) IMPLANT
SUT VIC AB 0 CT1 27XCR 8 STRN (SUTURE) IMPLANT
SUT VIC AB 2-0 SH 27XBRD (SUTURE) ×4 IMPLANT
SUT VIC AB 3-0 SH 18 (SUTURE) IMPLANT
SUT VICRYL 2-0 SH 8X27 (SUTURE) IMPLANT
SYR BULB EAR ULCER 3OZ GRN STR (SYRINGE) ×4 IMPLANT
TOWEL GREEN STERILE (TOWEL DISPOSABLE) ×4 IMPLANT
TOWEL GREEN STERILE FF (TOWEL DISPOSABLE) ×4 IMPLANT
TRAY FOLEY W/BAG SLVR 14FR LF (SET/KITS/TRAYS/PACK) ×4 IMPLANT

## 2024-11-04 NOTE — Interval H&P Note (Signed)
 History and Physical Interval Note:  11/04/2024 8:17 AM  [name] Sheryl Schmidt  has presented today for surgery, with the diagnosis of mixed incontinence; pelvic organ prolpase.  The various methods of treatment have been discussed with the patient and family. After consideration of risks, benefits and other options for treatment, the patient has consented to  Procedure(s): ANTERIOR (CYSTOCELE) AND POSTERIOR REPAIR (RECTOCELE) (N/A) PERINEOPLASTY (N/A) CREATION, URETHRAL SLING, RETROPUBIC APPROACH, USING POLYPROPYLENE TAPE (N/A) as a surgical intervention.  The patient's history has been reviewed, patient examined, no change in status, stable for surgery.  I have reviewed the patient's chart and labs.  Questions were answered to the patient's satisfaction.     Arizbeth Cawthorn ONEIDA Gillis

## 2024-11-04 NOTE — Anesthesia Procedure Notes (Signed)
 Procedure Name: Intubation Date/Time: 11/04/2024 8:58 AM  Performed by: Evette Ade, CRNAPre-anesthesia Checklist: Patient identified, Emergency Drugs available, Suction available, Patient being monitored and Timeout performed Patient Re-evaluated:Patient Re-evaluated prior to induction Oxygen Delivery Method: Circle system utilized Preoxygenation: Pre-oxygenation with 100% oxygen Induction Type: IV induction Ventilation: Mask ventilation without difficulty Laryngoscope Size: Mac and 3 Grade View: Grade I Tube type: Oral Tube size: 6.5 mm Number of attempts: 1 Airway Equipment and Method: Stylet Placement Confirmation: ETT inserted through vocal cords under direct vision, positive ETCO2 and breath sounds checked- equal and bilateral Secured at: 21 cm Tube secured with: Tape Dental Injury: Teeth and Oropharynx as per pre-operative assessment

## 2024-11-04 NOTE — Anesthesia Postprocedure Evaluation (Signed)
 Anesthesia Post Note  Patient: Sheryl Schmidt  Procedure(s) Performed: ANTERIOR (CYSTOCELE) AND POSTERIOR REPAIR (RECTOCELE) (Vagina ) PERINEOPLASTY (Perineum) CREATION, URETHRAL SLING, RETROPUBIC APPROACH, USING POLYPROPYLENE TAPE (Vagina ) CYSTOSCOPY (Bladder)     Patient location during evaluation: PACU Anesthesia Type: General Level of consciousness: awake and alert Pain management: pain level controlled Vital Signs Assessment: post-procedure vital signs reviewed and stable Respiratory status: spontaneous breathing, nonlabored ventilation, respiratory function stable and patient connected to nasal cannula oxygen Cardiovascular status: blood pressure returned to baseline and stable Postop Assessment: no apparent nausea or vomiting Anesthetic complications: no   No notable events documented.  Last Vitals:  Vitals:   11/04/24 1115 11/04/24 1130  BP: (!) 113/57 113/60  Pulse: 95 96  Resp: 14 17  Temp:    SpO2: 99% 99%    Last Pain:  Vitals:   11/04/24 1130  TempSrc:   PainSc: 0-No pain                 Rome Ade

## 2024-11-04 NOTE — Transfer of Care (Signed)
 Immediate Anesthesia Transfer of Care Note  Patient: Sheryl Schmidt  Procedure(s) Performed: ANTERIOR (CYSTOCELE) AND POSTERIOR REPAIR (RECTOCELE) (Vagina ) PERINEOPLASTY (Perineum) CREATION, URETHRAL SLING, RETROPUBIC APPROACH, USING POLYPROPYLENE TAPE (Vagina ) CYSTOSCOPY (Bladder)  Patient Location: PACU  Anesthesia Type:General  Level of Consciousness: awake and alert   Airway & Oxygen Therapy: Patient Spontanous Breathing and Patient connected to nasal cannula oxygen  Post-op Assessment: Report given to RN and Post -op Vital signs reviewed and stable  Post vital signs: Reviewed and stable  Last Vitals:  Vitals Value Taken Time  BP 119/54 11/04/24 10:45  Temp    Pulse 112 11/04/24 10:46  Resp 19 11/04/24 10:46  SpO2 98 % 11/04/24 10:46  Vitals shown include unfiled device data.  Last Pain:  Vitals:   11/04/24 9287  TempSrc: Oral  PainSc: 0-No pain      Patients Stated Pain Goal: 5 (11/04/24 9287)  Complications: No notable events documented.

## 2024-11-04 NOTE — Discharge Instructions (Addendum)

## 2024-11-04 NOTE — Anesthesia Preprocedure Evaluation (Signed)
 Anesthesia Evaluation  Patient identified by MRN, date of birth, ID band Patient awake    Reviewed: Allergy  & Precautions, NPO status , Patient's Chart, lab work & pertinent test results  History of Anesthesia Complications Negative for: history of anesthetic complications  Airway Mallampati: II  TM Distance: >3 FB Neck ROM: Full    Dental no notable dental hx. (+) Teeth Intact   Pulmonary neg pulmonary ROS, neg sleep apnea, neg COPD, Patient abstained from smoking.Not current smoker   Pulmonary exam normal breath sounds clear to auscultation       Cardiovascular Exercise Tolerance: Good METShypertension, Pt. on medications (-) CAD and (-) Past MI (-) dysrhythmias  Rhythm:Regular Rate:Normal - Systolic murmurs    Neuro/Psych negative neurological ROS  negative psych ROS   GI/Hepatic ,GERD  Controlled,,(+)     (-) substance abuse    Endo/Other  neg diabetes    Renal/GU negative Renal ROS     Musculoskeletal   Abdominal   Peds  Hematology   Anesthesia Other Findings Past Medical History: No date: Allergy  No date: Cholelithiasis No date: Hypertension No date: Internal hemorrhoid  Reproductive/Obstetrics                              Anesthesia Physical Anesthesia Plan  ASA: 2  Anesthesia Plan: General   Post-op Pain Management: Tylenol  PO (pre-op)*, Gabapentin PO (pre-op)* and Toradol IV (intra-op)*   Induction: Intravenous  PONV Risk Score and Plan: 4 or greater and Ondansetron  and Dexamethasone   Airway Management Planned: Oral ETT  Additional Equipment: None  Intra-op Plan:   Post-operative Plan: Extubation in OR  Informed Consent: I have reviewed the patients History and Physical, chart, labs and discussed the procedure including the risks, benefits and alternatives for the proposed anesthesia with the patient or authorized representative who has indicated his/her  understanding and acceptance.     Dental advisory given  Plan Discussed with: CRNA and Surgeon  Anesthesia Plan Comments: (Discussed risks of anesthesia with patient, including PONV, sore throat, lip/dental/eye damage. Rare risks discussed as well, such as cardiorespiratory and neurological sequelae, and allergic reactions. Discussed the role of CRNA in patient's perioperative care. Patient understands.)        Anesthesia Quick Evaluation

## 2024-11-04 NOTE — Telephone Encounter (Signed)
 Exam under anesthesia, anterior and posterior repair, midurethral sling, cystourethroscopy 11/04/24. Please schedule for repeat void trial in the office.

## 2024-11-04 NOTE — OR Nursing (Signed)
 Voiding trial begins.  300 cc instilled into bladder via cathetor, cathetor balloon deflated and removed patient assisted to restroom.  Patient was unable to void.  Dr. Guadlupe notified and instructed to reinsert foley cathetor..  Foley cathetor 53f inserted using sterile technique. Patient and patients sister Illona given instructions on how to care for cathetor at home .  Andree Kerns rn

## 2024-11-04 NOTE — Op Note (Signed)
 Operative Note  Preoperative Diagnosis: Stage III anterior vaginal prolapse, mixed urinary incontinence  Postoperative Diagnosis: Stage III anterior vaginal prolapse, mixed urinary incontinence  Procedures performed:  Exam under anesthesia, anterior and posterior repair, midurethral sling, cystourethroscopy   Implants:  Implant Name Type Inv. Item Serial No. Manufacturer Lot No. LRB No. Used Action  Sheryl Schmidt [facility] - [id number] Sling SLING ADVANTAGE FIT [facility]  [employer] [id number] N/A 1 Implanted    Attending Surgeon: Lianne Leila Gillis, MD  Assistant Surgeon: n/a  Assistant: Jorene Moats, PA  Anesthesia: General endotracheal  Findings: 1. On vaginal exam, stage III anterior vaginal wall prolapse present  2. On cystoscopy, normal bladder and urethral mucosa without injury or lesion. Brisk bilateral ureteral efflux present.    Specimens: * No specimens in log *  Estimated blood loss: 50 mL  IV fluids: 750 mL  Urine output: 350 mL  Complications: none  Procedure in Detail: After informed consent was obtained, the patient was taken to the operating room where she was placed under anesthesia.  She was then placed in the dorsal lithotomy position with Allen stirrups and prepped and draped in the usual sterile fashion.  Care was taken to avoid hyperflexion or hyperextension of her lower extremities.    A self-retaining retractor was placed, and a foley catheter was placed. The cervix was grasped with an Allis clamp with no descent noted from the apex.  For the anterior repair, two Allis clamps were placed along the midline of the anterior vaginal wall.  1% lidocaine  with epinephrine  was injected into the vaginal mucosa.  A 15 blade scalpel was used to incise the vaginal mucosa in the midline. Allis clamps were placed along this incision and Metzenbaum scissors were used to sharply dissect the epithelium off of the vesicovaginal septum bilaterally to the level of the  pubic rami. Anterior plication of the vesicovaginal septum was then performed using 2-0 PDS. Hemostasis was noted. The Foley catheter was removed. A 70-degree cystoscope was introduced, and 360-degree inspection revealed no trauma in the bladder, with bilateral ureteral efflux. The cystoscope was removed. The medial uterosacral sutures were then tied down. Cystoscopy was repeated and brisk bilateral ureteral efflux was noted. The bladder was drained and the cystoscope was removed.  The Foley catheter was reinserted. The vaginal mucosal edges were trimmed and the incision reapproximated with 2-0 Vicryl in a continuous running fashion.   The retropubic midurethral sling was performed next. Two Allis clamps were placed lateral to the location of the midurethra. 1% lidocaine  with epinephrine  was injected into the vaginal mucosa and laterally and at the suprapubic trocar exit sites marked 1 fingerbreadth lateral to midline at the level of the pubic symphysis.  Using a 15 blade scapel a vertical incision was made in the vaginal mucosa. Metzenbaum scissors were used to dissect the vaginal mucosa off of the underlying muscularis and to create the periurethral tunnels. The trocar and attached sling were introduced into the right side of the vaginal incision, just inferior to the pubic symphysis on the right side. The trocar was guided through the endopelvic fascia and directly behind the pubic symphysis through the retropubic space, vertically.  The trocar was guided out through the suprapubic region with the marked ipsilateral suprapubic exit site and shoulder. The trocar was similarly placed on the left side.  The Foley catheter was removed.  A 70-degree cystoscope was introduced, and 360-degree inspection revealed no trauma or trocars in the bladder, with bilateral ureteral efflux.  The bladder was drained and the cystoscope was removed. The sling was brought to lie beneath the mid-urethra.  A Babcock was placed on a  loop of mesh as the trocars were removed. The sling was released and a right angle clamp was placed behind the sling to ensure a tension free placement as the plastic sheath was removed from the sling and the distal ends of the sling were trimmed just below the level of the skin incisions. The skin incisions were closed with dermabond.  Hemostasis was noted.  Tension-free positioning of the sling was confirmed and a Crede maneuver did not create leakage form the urethal meatus.  The vaginal incision was closed with 2-0 vicryl.  The Foley catheter was reinserted.  Attention was then turned to the posterior vagina.  Two Allis clamps were placed at the introitus approximately 3cm from the urethra meatus at the posterior fourchette. 1% lidocaine  with epinephrine  was injected into the vaginal mucosa in the posterior vaginal wall for hydrodissection and hemostasis. A midline vertical incision was made between these clamps with a 15 blade scalpel along the posterior vaginal wall to the introitus.  The rectovaginal septum was then dissected off the vaginal mucosa bilaterally. The rectovaginal septum was then plicated in a continuous running fashion with 2-0 PDS while one finger was placed in the rectum to prevent rectal penetration.  After placement of the first plication stitch two fingers were inserted into the vaginal to confirm adequate caliber.  The suture incorporated the perineal body in a U stitch fashion and the bulbocavernosus muscles. A 2-0 Vicryl was used in a subcuticular fashion to re-approximate the hymenal ring. After plication, the excess vaginal mucosa was trimmed and the vaginal mucosa was reapproximated using 2-0 Vicryl suture in a continuous fashion.  The vagina was copiously irrigated.  Hemostasis was noted. A rectal examination was normal and confirmed no sutures within the rectum. Three fingers passed through the vaginal opening without difficulty.  The patient tolerated the procedure well.  She  was awakened from anesthesia and transferred to the recovery room in stable condition. All counts were correct x 2.

## 2024-11-05 ENCOUNTER — Ambulatory Visit

## 2024-11-05 ENCOUNTER — Encounter (HOSPITAL_COMMUNITY): Payer: Self-pay | Admitting: Obstetrics

## 2024-11-05 NOTE — Progress Notes (Signed)
 Sheryl Schmidt  underwent Anterior (cystocele) And Posterior Repair (rectocele), Perineoplasty, Creation, Urethral Sling, Retropubic Approach, Using Polypropylene Tape, and Cystoscopy  on 11/04/2024  with [] Dr Marilynne [] Dr Guadlupe.  The patient reports that her pain is controlled.  She is taking [] No Medication [x] Acetaminophen  500mg  every 6 hours [x] Ibuprofen  600mg  every 6 hours or []  Prescribed Narcotic.  Her pain level is 0[] with medication [] Without medication is.   She reports vaginal bleeding.  The patient is tolerating PO fluids and solids. She has not had a bowel movement and is taking Miralax  for a bowel regimen. She is not passing gas.  She was discharged with a catheter.   []  Discharged without a catheter, the patient does feel as if she is emptying her bladder.  [] Discharged with a catheter, the patient is not having any concerns with her catheter.  She will return for a voiding trial. [] Verified scheduled date and time with patient.  She does not having any additional questions.  Reviewed Post operative instructions as needed to answer additional questions.   CC'd note to patient's provider.  Sheryl Schmidt underwent Anterior (cystocele) And Posterior Repair (rectocele), Perineoplasty, Creation, Urethral Sling, Retropubic Approach, Using Polypropylene Tape, and Cystoscopy on 11/04/2024  She presents for a voiding trial.   Patient was identified with 2 identifiers.  The patient states she does not have any concerns with the foley placed.  320 mL of NS was instilled into the bladder via a catheter.  The catheter was removed and patient was instructed to void into the urinary hat.  She voided 290 mL.  The post void residual measured by bladder scan was 37 mL.  She did pass the voiding trial.  The patient was not sent home with a catheter.    The patient received aftercare instructions and will follow up as scheduled.

## 2024-11-06 NOTE — Patient Instructions (Signed)
  Please drink lots of water and try to expel as much urine as you are inputting. If you are unable to urinate, give the office a call before 2 pm.     Please keep all future appointments and if you have any questions or concerns please feel free to contact our office at [phone].

## 2024-12-14 ENCOUNTER — Encounter: Payer: Self-pay | Admitting: *Deleted

## 2024-12-16 ENCOUNTER — Encounter: Payer: Self-pay | Admitting: Obstetrics

## 2024-12-16 ENCOUNTER — Ambulatory Visit (INDEPENDENT_AMBULATORY_CARE_PROVIDER_SITE_OTHER): Admitting: Obstetrics

## 2024-12-16 VITALS — BP 109/73 | HR 66

## 2024-12-16 DIAGNOSIS — Z09 Encounter for follow-up examination after completed treatment for conditions other than malignant neoplasm: Secondary | ICD-10-CM | POA: Insufficient documentation

## 2024-12-16 DIAGNOSIS — Z48816 Encounter for surgical aftercare following surgery on the genitourinary system: Secondary | ICD-10-CM

## 2024-12-16 NOTE — Patient Instructions (Signed)
 Please call if you experience change in urinary or vaginal symptoms.

## 2024-12-16 NOTE — Progress Notes (Signed)
 [facility]  Date of Visit: 12/16/2024  History of Present Illness: Ms. Sheryl Schmidt is a 74 y.o. female scheduled today for a post-operative visit.   Surgery: s/p Exam under anesthesia, anterior and posterior repair, midurethral sling, cystourethroscopy  on 11/04/24  She passed her postoperative void trial POD#1.   Postoperative course was uncomplicated.   Today she reports doing well  UTI in the last 6 weeks? No  Pain? No  She has returned to her normal activity (except for postop restrictions) Vaginal bulge? No  Stress incontinence: No  Urgency/frequency: No  Urge incontinence: No  Voiding dysfunction: No  Bowel issues: No   Subjective Success: Do you usually have a bulge or something falling out that you can see or feel in the vaginal area? No  Retreatment Success: Any retreatment with surgery or pessary for any compartment? No   Medications: She has a current medication list which includes the following prescription(s): acetaminophen , amlodipine, vitamin c, aspirin, azelastine, cetirizine, coq10, flaxseed (linseed), garlic, ibuprofen , krill oil, nutritional supplements, ondansetron , prednisone , rosuvastatin, turmeric, valacyclovir, and vitamin d.   Allergies: Patient is allergic to losartan potassium, other, sulfonamide derivatives, and telmisartan.   Physical Exam: BP 109/73   Pulse 66   Abdomen: soft, non-tender, without masses or organomegaly Suprapubic Incisions: healing well, no significant drainage, no dehiscence, no significant erythema.  Pelvic Examination: Vagina: Incisions healing well, no significant drainage, no dehiscence, no significant erythema. Sutures are present at incision line and there is not granulation tissue. No tenderness along the anterior or posterior vagina. No apical tenderness. No pelvic masses. No visible or palpable mesh.  POP-Q: POP-Q  -3-3                                            Aa   -3-3                                            Ba  -8-8                                              C   22                                            Gh  33                                            Pb  88                                            tvl   -3-3  Ap  -3-3                                            Bp                                                 D    ---------------------------------------------------------  Assessment and Plan:  1. Postop check    - Can resume regular activity including exercise and intercourse,  if desired.  - Discussed avoidance of heavy lifting and straining long term to reduce the risk of recurrence.   All questions answered.   Return in about 6 weeks (around 01/27/2025).

## 2025-02-04 ENCOUNTER — Encounter: Admitting: Obstetrics
